# Patient Record
Sex: Female | Born: 1980 | Race: Asian | Hispanic: No | Marital: Married | Smoking: Never smoker
Health system: Southern US, Community
[De-identification: ages and names within clinical notes are randomized; demographics above are authoritative.]

## PROBLEM LIST (undated history)

## (undated) ENCOUNTER — Inpatient Hospital Stay (HOSPITAL_COMMUNITY): Payer: Self-pay

## (undated) DIAGNOSIS — Z789 Other specified health status: Secondary | ICD-10-CM

## (undated) HISTORY — PX: NO PAST SURGERIES: SHX2092

---
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
## 2011-01-06 ENCOUNTER — Ambulatory Visit: Payer: Self-pay

## 2013-09-14 ENCOUNTER — Encounter (HOSPITAL_COMMUNITY): Payer: Self-pay | Admitting: Emergency Medicine

## 2013-09-14 ENCOUNTER — Emergency Department (HOSPITAL_COMMUNITY)
Admission: EM | Admit: 2013-09-14 | Discharge: 2013-09-14 | Disposition: A | Discharge status: Home / Self Care | Payer: Self-pay | Attending: Emergency Medicine | Admitting: Emergency Medicine

## 2013-09-14 DIAGNOSIS — Z79899 Other long term (current) drug therapy: Secondary | ICD-10-CM | POA: Insufficient documentation

## 2013-09-14 DIAGNOSIS — Z3202 Encounter for pregnancy test, result negative: Secondary | ICD-10-CM | POA: Insufficient documentation

## 2013-09-14 DIAGNOSIS — K2970 Gastritis, unspecified, without bleeding: Secondary | ICD-10-CM | POA: Insufficient documentation

## 2013-09-14 DIAGNOSIS — R109 Unspecified abdominal pain: Secondary | ICD-10-CM | POA: Insufficient documentation

## 2013-09-14 DIAGNOSIS — K2990 Gastroduodenitis, unspecified, without bleeding: Secondary | ICD-10-CM

## 2013-09-14 DIAGNOSIS — Z87891 Personal history of nicotine dependence: Secondary | ICD-10-CM | POA: Insufficient documentation

## 2013-09-14 LAB — PREGNANCY, URINE: PREG TEST UR: NEGATIVE

## 2013-09-14 LAB — CBC WITH DIFFERENTIAL/PLATELET
BASOS ABS: 0 10*3/uL (ref 0.0–0.1)
Basophils Relative: 0 % (ref 0–1)
Eosinophils Absolute: 0.1 10*3/uL (ref 0.0–0.7)
Eosinophils Relative: 2 % (ref 0–5)
HCT: 41.5 % (ref 36.0–46.0)
Hemoglobin: 13.5 g/dL (ref 12.0–15.0)
LYMPHS PCT: 55 % — AB (ref 12–46)
Lymphs Abs: 3.1 10*3/uL (ref 0.7–4.0)
MCH: 27.6 pg (ref 26.0–34.0)
MCHC: 32.5 g/dL (ref 30.0–36.0)
MCV: 84.9 fL (ref 78.0–100.0)
MONO ABS: 0.2 10*3/uL (ref 0.1–1.0)
Monocytes Relative: 4 % (ref 3–12)
NEUTROS ABS: 2.2 10*3/uL (ref 1.7–7.7)
NEUTROS PCT: 39 % — AB (ref 43–77)
Platelets: 187 10*3/uL (ref 150–400)
RBC: 4.89 MIL/uL (ref 3.87–5.11)
RDW: 13 % (ref 11.5–15.5)
WBC: 5.7 10*3/uL (ref 4.0–10.5)

## 2013-09-14 LAB — URINALYSIS, ROUTINE W REFLEX MICROSCOPIC
Bilirubin Urine: NEGATIVE
GLUCOSE, UA: NEGATIVE mg/dL
Hgb urine dipstick: NEGATIVE
Ketones, ur: NEGATIVE mg/dL
Nitrite: NEGATIVE
Protein, ur: NEGATIVE mg/dL
SPECIFIC GRAVITY, URINE: 1.025 (ref 1.005–1.030)
UROBILINOGEN UA: 0.2 mg/dL (ref 0.0–1.0)
pH: 6.5 (ref 5.0–8.0)

## 2013-09-14 LAB — COMPREHENSIVE METABOLIC PANEL
ALK PHOS: 61 U/L (ref 39–117)
ALT: 13 U/L (ref 0–35)
AST: 18 U/L (ref 0–37)
Albumin: 4.3 g/dL (ref 3.5–5.2)
Anion gap: 13 (ref 5–15)
BUN: 16 mg/dL (ref 6–23)
CHLORIDE: 101 meq/L (ref 96–112)
CO2: 27 meq/L (ref 19–32)
CREATININE: 0.83 mg/dL (ref 0.50–1.10)
Calcium: 9.7 mg/dL (ref 8.4–10.5)
GFR calc Af Amer: >90 mL/min (ref >90)
Glucose, Bld: 98 mg/dL (ref 70–99)
POTASSIUM: 3.7 meq/L (ref 3.7–5.3)
Sodium: 141 mEq/L (ref 137–147)
Total Bilirubin: 0.6 mg/dL (ref 0.3–1.2)
Total Protein: 8.3 g/dL (ref 6.0–8.3)

## 2013-09-14 LAB — URINE MICROSCOPIC-ADD ON

## 2013-09-14 LAB — LIPASE, BLOOD: Lipase: 36 U/L (ref 11–59)

## 2013-09-14 MED ORDER — PANTOPRAZOLE SODIUM 40 MG IV SOLR
40.0000 mg | Freq: Once | INTRAVENOUS | Status: AC
  Administered 2013-09-14: 40 mg via INTRAVENOUS
  Filled 2013-09-14: qty 40

## 2013-09-14 MED ORDER — ONDANSETRON HCL 4 MG/2ML IJ SOLN
4.0000 mg | Freq: Once | INTRAMUSCULAR | Status: AC
  Administered 2013-09-14: 4 mg via INTRAVENOUS
  Filled 2013-09-14: qty 2

## 2013-09-14 MED ORDER — MORPHINE SULFATE 4 MG/ML IJ SOLN
4.0000 mg | INTRAMUSCULAR | Status: DC | PRN
  Administered 2013-09-14: 4 mg via INTRAVENOUS
  Filled 2013-09-14: qty 1

## 2013-09-14 MED ORDER — HYDROCODONE-ACETAMINOPHEN 5-325 MG PO TABS: 1.0000 | ORAL_TABLET | ORAL | Status: DC | PRN

## 2013-09-14 MED ORDER — OMEPRAZOLE 20 MG PO CPDR: 20.0000 mg | DELAYED_RELEASE_CAPSULE | Freq: Two times a day (BID) | ORAL | Status: DC

## 2013-09-14 MED ORDER — ONDANSETRON 4 MG PO TBDP: 4.0000 mg | ORAL_TABLET | Freq: Three times a day (TID) | ORAL | Status: DC | PRN

## 2013-09-14 NOTE — ED Notes (Signed)
MD at bedside. 

## 2013-09-14 NOTE — Discharge Instructions (Signed)
Abdominal Pain °Many things can cause abdominal pain. Usually, abdominal pain is not caused by a disease and will improve without treatment. It can often be observed and treated at home. Your health care provider will do a physical exam and possibly order blood tests and X-rays to help determine the seriousness of your pain. However, in many cases, more time must pass before a clear cause of the pain can be found. Before that point, your health care provider may not know if you need more testing or further treatment. °HOME CARE INSTRUCTIONS  °Monitor your abdominal pain for any changes. The following actions may help to alleviate any discomfort you are experiencing: °· Only take over-the-counter or prescription medicines as directed by your health care provider. °· Do not take laxatives unless directed to do so by your health care provider. °· Try a clear liquid diet (broth, tea, or water) as directed by your health care provider. Slowly move to a bland diet as tolerated. °SEEK MEDICAL CARE IF: °· You have unexplained abdominal pain. °· You have abdominal pain associated with nausea or diarrhea. °· You have pain when you urinate or have a bowel movement. °· You experience abdominal pain that wakes you in the night. °· You have abdominal pain that is worsened or improved by eating food. °· You have abdominal pain that is worsened with eating fatty foods. °· You have a fever. °SEEK IMMEDIATE MEDICAL CARE IF:  °· Your pain does not go away within 2 hours. °· You keep throwing up (vomiting). °· Your pain is felt only in portions of the abdomen, such as the right side or the left lower portion of the abdomen. °· You pass bloody or black tarry stools. °MAKE SURE YOU: °· Understand these instructions.   °· Will watch your condition.   °· Will get help right away if you are not doing well or get worse.   °Document Released: 10/04/2004 Document Revised: 12/30/2012 Document Reviewed: 09/03/2012 °ExitCare® Patient Information  ©2015 [employer]. This information is not intended to replace advice given to you by your health care provider. Make sure you discuss any questions you have with your health care provider. ° °Gastritis, Adult °Gastritis is soreness and puffiness (inflammation) of the lining of the stomach. If you do not get help, gastritis can cause bleeding and sores (ulcers) in the stomach. °HOME CARE  °· Only take medicine as told by your doctor. °· If you were given antibiotic medicines, take them as told. Finish the medicines even if you start to feel better. °· Drink enough fluids to keep your pee (urine) clear or pale yellow. °· Avoid foods and drinks that make your problems worse. Foods you may want to avoid include: °¨ Caffeine or alcohol. °¨ Chocolate. °¨ Mint. °¨ Garlic and onions. °¨ Spicy foods. °¨ Citrus fruits, including oranges, lemons, or limes. °¨ Food containing tomatoes, including sauce, chili, salsa, and pizza. °¨ Fried and fatty foods. °· Eat small meals throughout the day instead of large meals. °GET HELP RIGHT AWAY IF:  °· You have black or dark red poop (stools). °· You throw up (vomit) blood. It may look like coffee grounds. °· You cannot keep fluids down. °· Your belly (abdominal) pain gets worse. °· You have a fever. °· You do not feel better after 1 week. °· You have any other questions or concerns. °MAKE SURE YOU:  °· Understand these instructions. °· Will watch your condition. °· Will get help right away if you are not doing well   or get worse. °Document Released: 06/13/2007 Document Revised: 03/19/2011 Document Reviewed: 02/07/2011 °ExitCare® Patient Information ©2015 [employer]. This information is not intended to replace advice given to you by your health care provider. Make sure you discuss any questions you have with your health care provider. ° °

## 2013-09-14 NOTE — ED Notes (Signed)
Pt arrived to the Ed with a complaint of abdominal pain.  Pt states the pain is located in the right and left mid abdomen area with radiation to bilateral flanks.

## 2013-09-14 NOTE — ED Provider Notes (Signed)
CSN: [id number]     Arrival date & time 09/14/13  0544 History   First MD Initiated Contact with Patient 09/14/13 0645     Chief Complaint  Patient presents with  . Abdominal Pain      HPI  Pt presents with AP since last pm at 10:00.  Upper abdominal pain. Bilateral. Radiates to her flanks. No pain to the midline back. Some mild nausea tonight but no vomiting. Normal bowel movement this morning without diarrhea. No urinary symptoms. Does not smoke. Had a margarita with lunch yesterday. No heavy alcohol use. Anti-inflammatory use. No history of biliary symptoms or food intolerance. No prior episodes. Denies pregnancy.  No lower abdominal symptoms.  History reviewed. No pertinent past medical history. History reviewed. No pertinent past surgical history. History reviewed. No pertinent family history. History  Substance Use Topics  . Smoking status: Former Games developer  . Smokeless tobacco: Not on file  . Alcohol Use: Yes   OB History   Grav Para Term Preterm Abortions TAB SAB Ect Mult Living                 Review of Systems  Constitutional: Negative for fever, chills, diaphoresis, appetite change and fatigue.  HENT: Negative for mouth sores, sore throat and trouble swallowing.   Eyes: Negative for visual disturbance.  Respiratory: Negative for cough, chest tightness, shortness of breath and wheezing.   Cardiovascular: Negative for chest pain.  Gastrointestinal: Positive for nausea and abdominal pain. Negative for vomiting, diarrhea and abdominal distention.  Endocrine: Negative for polydipsia, polyphagia and polyuria.  Genitourinary: Negative for dysuria, frequency and hematuria.  Musculoskeletal: Negative for gait problem.  Skin: Negative for color change, pallor and rash.  Neurological: Negative for dizziness, syncope, light-headedness and headaches.  Hematological: Does not bruise/bleed easily.  Psychiatric/Behavioral: Negative for behavioral problems and confusion.       Allergies  Review of patient's allergies indicates no known allergies.  Home Medications   Prior to Admission medications   Medication Sig Start Date End Date Taking? Authorizing Provider  HYDROcodone-acetaminophen (NORCO/VICODIN) 5-325 MG per tablet Take 1 tablet by mouth every 4 (four) hours as needed. 09/14/13   Rolland Porter, MD  omeprazole (PRILOSEC) 20 MG capsule Take 1 capsule (20 mg total) by mouth 2 (two) times daily. 09/14/13   Rolland Porter, MD  ondansetron (ZOFRAN ODT) 4 MG disintegrating tablet Take 1 tablet (4 mg total) by mouth every 8 (eight) hours as needed for nausea. 09/14/13   Rolland Porter, MD   BP 139/88  Pulse 71  Temp(Src) 98.1 F (36.7 C) (Oral)  Resp 16  SpO2 100%  LMP 09/06/2013 Physical Exam  Constitutional: She is oriented to person, place, and time. She appears well-developed and well-nourished. No distress.  HENT:  Head: Normocephalic.  Eyes: Conjunctivae are normal. Pupils are equal, round, and reactive to light. No scleral icterus.  Neck: Normal range of motion. Neck supple. No thyromegaly present.  Cardiovascular: Normal rate and regular rhythm.  Exam reveals no gallop and no friction rub.   No murmur heard. Pulmonary/Chest: Effort normal and breath sounds normal. No respiratory distress. She has no wheezes. She has no rales.  Abdominal: Soft. Bowel sounds are normal. She exhibits no distension. There is no tenderness. There is no rigidity, no rebound and no guarding.    Musculoskeletal: Normal range of motion.       Back:  Neurological: She is alert and oriented to person, place, and time.  Skin: Skin is warm and dry.  No rash noted.  Psychiatric: She has a normal mood and affect. Her behavior is normal.    ED Course  Procedures (including critical care time) Labs Review Labs Reviewed  URINALYSIS, ROUTINE W REFLEX MICROSCOPIC - Abnormal; Notable for the following:    APPearance CLOUDY (*)    Leukocytes, UA SMALL (*)    All other components  within normal limits  CBC WITH DIFFERENTIAL - Abnormal; Notable for the following:    Neutrophils Relative % 39 (*)    Lymphocytes Relative 55 (*)    All other components within normal limits  PREGNANCY, URINE  COMPREHENSIVE METABOLIC PANEL  LIPASE, BLOOD  URINE MICROSCOPIC-ADD ON    Imaging Review No results found.   EKG Interpretation None      MDM   Final diagnoses:  Abdominal pain, unspecified abdominal location  Gastritis    Studies are reassuring. On recheck her pain is much improved. Up to 1/10. Points primarily to the epigastrium. I don't think she needs imaging ultrasound this time. Normal hepatobiliary and pancreatic enzymes. No leukocytosis. Plan is home. Bland diet. Small meals. Proton pump inhibitor. Followup if not improving.    Rolland Porter, MD 09/14/13 [phone]

## 2014-05-14 ENCOUNTER — Ambulatory Visit (INDEPENDENT_AMBULATORY_CARE_PROVIDER_SITE_OTHER): Payer: Self-pay | Admitting: Internal Medicine

## 2014-05-14 VITALS — BP 112/74 | HR 84 | Temp 98.2°F | Resp 16 | Ht 61.5 in | Wt 103.6 lb

## 2014-05-14 DIAGNOSIS — J029 Acute pharyngitis, unspecified: Secondary | ICD-10-CM

## 2014-05-14 MED ORDER — AMOXICILLIN 875 MG PO TABS: 875.0000 mg | ORAL_TABLET | Freq: Two times a day (BID) | ORAL | Status: DC

## 2014-05-14 NOTE — Progress Notes (Signed)
   Subjective:    Patient ID: Melissa Thomas, female    DOB: Apr 28, 1980, 34 y.o.   MRN: [id number] This chart was scribed for Ellamae Siaobert Elycia Woodside, MD by Littie Deedsichard Sun, Medical Scribe. This patient was seen in Room 2 and the patient's care was started at 6:00 PM.   HPI HPI Comments: Melissa Thomas is a 34 y.o. female who presents to the [facility] complaining of gradual onset sore throat that started 6 days ago. She reports having some difficulty swallowing and spitting due to the pain. She has no cough or nasal congestion but has noted chills. She also reports some difficulty with breathing yesterday, but has improved today. She had an episode of vomiting earlier today, but she attributes this to medications that she took. Patient denies fever and myalgias. She also denies sick contacts, but she does work at [employer]. No hx of tonsillectomy per patient.  Patient took the day off from work today due to illness.  Review of Systems Noncontributory    Objective:   Physical Exam  Constitutional: She is oriented to person, place, and time. She appears well-developed and well-nourished. No distress.  HENT:  Head: Normocephalic and atraumatic.  Nose: Nose normal.  TMs have old healed perforations and scars. Throat is very red with exudate. Tender 2+ AC nodes and 1 PC node.   Eyes: Conjunctivae are normal. Pupils are equal, round, and reactive to light.  Neck: Neck supple.  Cardiovascular: Normal rate and regular rhythm.   Pulmonary/Chest: Effort normal and breath sounds normal.  Musculoskeletal: She exhibits no edema.  Lymphadenopathy:    She has cervical adenopathy.  Neurological: She is alert and oriented to person, place, and time. No cranial nerve deficit.  Skin: Skin is warm and dry. No rash noted.  Psychiatric: She has a normal mood and affect. Her behavior is normal.  Vitals reviewed.     Assessment & Plan:  Bacterial pharyngitis--- amoxicillin 875 twice a day for 10  days  I have completed the patient encounter in its entirety as documented by the scribe, with editing by me where necessary. Ramya Vanbergen P. Merla Richesoolittle, M.D.

## 2015-08-11 ENCOUNTER — Ambulatory Visit: Payer: Self-pay

## 2015-08-19 ENCOUNTER — Emergency Department (HOSPITAL_BASED_OUTPATIENT_CLINIC_OR_DEPARTMENT_OTHER)
Admission: EM | Admit: 2015-08-19 | Discharge: 2015-08-19 | Disposition: A | Discharge status: Home / Self Care | Payer: Self-pay | Attending: Emergency Medicine | Admitting: Emergency Medicine

## 2015-08-19 ENCOUNTER — Encounter (HOSPITAL_BASED_OUTPATIENT_CLINIC_OR_DEPARTMENT_OTHER): Payer: Self-pay | Admitting: *Deleted

## 2015-08-19 DIAGNOSIS — H578 Other specified disorders of eye and adnexa: Secondary | ICD-10-CM | POA: Insufficient documentation

## 2015-08-19 DIAGNOSIS — L259 Unspecified contact dermatitis, unspecified cause: Secondary | ICD-10-CM | POA: Insufficient documentation

## 2015-08-19 DIAGNOSIS — R21 Rash and other nonspecific skin eruption: Secondary | ICD-10-CM

## 2015-08-19 LAB — RAPID HIV SCREEN (HIV 1/2 AB+AG)
HIV 1/2 Antibodies: NONREACTIVE
HIV-1 P24 ANTIGEN - HIV24: NONREACTIVE

## 2015-08-19 MED ORDER — HYDROXYZINE HCL 25 MG PO TABS
25.0000 mg | ORAL_TABLET | Freq: Once | ORAL | Status: AC
  Administered 2015-08-19: 25 mg via ORAL
  Filled 2015-08-19: qty 1

## 2015-08-19 MED ORDER — PREDNISONE 10 MG PO TABS: ORAL_TABLET | ORAL | 0 refills | Status: DC

## 2015-08-19 MED ORDER — PREDNISONE 10 MG PO TABS
60.0000 mg | ORAL_TABLET | Freq: Once | ORAL | Status: AC
  Administered 2015-08-19: 60 mg via ORAL
  Filled 2015-08-19: qty 1

## 2015-08-19 MED ORDER — HYDROXYZINE HCL 25 MG PO TABS: 25.0000 mg | ORAL_TABLET | Freq: Four times a day (QID) | ORAL | 0 refills | Status: DC

## 2015-08-19 NOTE — ED Triage Notes (Signed)
Rash since July. She was treated for scabies with no improvement. Her boyfriend has never had the rash.

## 2015-08-19 NOTE — ED Provider Notes (Signed)
[facility]   CSN: [id number] Arrival date & time: 08/19/15  2019  First Provider Contact:    First MD Initiated Contact with Patient 08/19/15 2030    By signing my name below, I, Levon Hedger, attest that this documentation has been prepared under the direction and in the presence of non-physician practitioner, Jaynie Crumble, PA-C  Electronically Signed: Levon Hedger, Scribe. 08/19/2015. 8:41 PM.   History   Chief Complaint Chief Complaint  Patient presents with  . Rash   HPI Melissa Thomas is a 35 y.o. female who presents to the Emergency Department complaining of itching, dark, worsening, rash to full body since 07/27/15. Pt states she went to [employer] and used Darene Lamer on her legs, states the rash showed up afterwards. Pt states she also changed dryer sheets recently, but denies change in lotions or soaps.  Pt was seen at [facility] and diagnosed with scabies. She was prescribed Permethrin which she had used daily.She has also been using hydrocortisone cream. She notes associated facial swelling and watery eyes. She denies any sick contact. She has no hx of eczema. Pt denies fever or lesions in her mouth. No generalized malaise or headache.  Boyfriend does not have a rash.  .  The history is provided by the patient. No language interpreter was used.    No past medical history on file.  There are no active problems to display for this patient.   No past surgical history on file.  OB History    No data available      Home Medications    Prior to Admission medications   Medication Sig Start Date End Date Taking? Authorizing Provider  amoxicillin (AMOXIL) 875 MG tablet Take 1 tablet (875 mg total) by mouth 2 (two) times daily. 05/14/14   Tonye Pearson, MD    Family History No family history on file.  Social History Social History  Substance Use Topics  . Smoking status: Never Smoker  . Smokeless tobacco: Not on file  . Alcohol use 0.0  oz/week     Allergies   Review of patient's allergies indicates no known allergies.   Review of Systems Review of Systems  Constitutional: Negative for fever.  HENT: Positive for facial swelling.   Eyes: Positive for discharge.  Skin: Positive for color change and rash.  All other systems reviewed and are negative.  Physical Exam Updated Vital Signs There were no vitals taken for this visit.  Physical Exam  Constitutional: She is oriented to person, place, and time. She appears well-developed and well-nourished. No distress.  HENT:  Head: Normocephalic and atraumatic.  Eyes: Conjunctivae are normal.  Cardiovascular: Normal rate.   Pulmonary/Chest: Effort normal.  Abdominal: She exhibits no distension.  Neurological: She is alert and oriented to person, place, and time.  Skin: Skin is warm and dry.  Diffuse dry, scaly, erythematous rash to bilateral legs, chest, abdomen, trunk, arms, neck, face. Multiple excoriations. Rash is blanching. Large plaques of this rash to bilateral anterior shins. Spares ankles, wrists, hands, webs of the toes and hands. Several spots noted to the left palm. No oral mucosal involvement.  Psychiatric: She has a normal mood and affect.  Nursing note and vitals reviewed.   ED Treatments / Results  DIAGNOSTIC STUDIES:  Oxygen Saturation is 100% on RA, normal by my interpretation.    COORDINATION OF CARE:  8:37 PM Discussed treatment plan which includes prednisone and hydroxyzine with pt at bedside and pt agreed to plan.  Labs (all labs ordered are listed, but only abnormal results are displayed) Labs Reviewed - No data to display  EKG  EKG Interpretation None       Radiology No results found.  Procedures Procedures (including critical care time)  Medications Ordered in ED Medications - No data to display   Initial Impression / Assessment and Plan / ED Course  I have reviewed the triage vital signs and the nursing  notes.  Pertinent labs & imaging results that were available during my care of the patient were reviewed by me and considered in my medical decision making (see chart for details).  Clinical Course    Patient with diffuse erythematous, scaly and dry rash. Rash does involve her palms, however it is mostly covers her shins, back, upper arms, forearms, neck and face. Multiple excoriations noted, states rash is very itchy. Did not respond to permethrin. Boyfriend does not have a rash, doubt scabies. Suspect most likely contact dermatitis from either Darene LamerNair, plants, or new dryer sheets. Due to diffuse widespread, will start on oral steroids. Will give a present is on taper and Vistaril for itching. Patient has no systemic symptoms at this time. No fever. She is nontoxic appearing otherwise. Will have her follow-up with dermatology.  RPR and HIV obtained  Final Clinical Impressions(s) / ED Diagnoses   Final diagnoses:  Rash  Contact dermatitis   I personally performed the services described in this documentation, which was scribed in my presence. The recorded information has been reviewed and is accurate.    New Prescriptions Discharge Medication List as of 08/19/2015  8:57 PM    START taking these medications   Details  hydrOXYzine (ATARAX/VISTARIL) 25 MG tablet Take 1 tablet (25 mg total) by mouth every 6 (six) hours., Starting Fri 08/19/2015, Print    predniSONE (DELTASONE) 10 MG tablet Take 6 tabs day 1, take 5 tabs day 2 and 3, take 4 tabs day 4 and 5, take 3 tabs day 6 and 7, take 2 tabs day 8 and 9, take 1 tab day 10 and 11, Print         Jaynie Crumbleatyana Orabelle Rylee, PA-C 08/19/15 2341    Nelva Nayobert Beaton, MD 08/20/15 [phone]

## 2015-08-19 NOTE — Discharge Instructions (Signed)
Prednisone as prescribed until all gone. Vistaril for itching. You can try calamine or Aveeno lotion to help with itching. Use only hypoallergenic soaps and detergents. If not improving, follow up with dermatology.

## 2015-08-19 NOTE — ED Notes (Signed)
Pt verbalizes understanding of d/c instructions and denies any further needs at this time. 

## 2015-08-21 LAB — RPR: RPR: NONREACTIVE

## 2015-12-08 ENCOUNTER — Inpatient Hospital Stay (HOSPITAL_COMMUNITY)
Admission: AD | Admit: 2015-12-08 | Discharge: 2015-12-09 | Disposition: A | Discharge status: Home / Self Care | Payer: BLUE CROSS/BLUE SHIELD | Source: Ambulatory Visit | Attending: Obstetrics and Gynecology | Admitting: Obstetrics and Gynecology

## 2015-12-08 ENCOUNTER — Encounter (HOSPITAL_COMMUNITY): Payer: Self-pay | Admitting: *Deleted

## 2015-12-08 DIAGNOSIS — Z3A14 14 weeks gestation of pregnancy: Secondary | ICD-10-CM | POA: Diagnosis not present

## 2015-12-08 DIAGNOSIS — R102 Pelvic and perineal pain: Secondary | ICD-10-CM | POA: Insufficient documentation

## 2015-12-08 DIAGNOSIS — O26892 Other specified pregnancy related conditions, second trimester: Secondary | ICD-10-CM | POA: Diagnosis not present

## 2015-12-08 DIAGNOSIS — N949 Unspecified condition associated with female genital organs and menstrual cycle: Secondary | ICD-10-CM | POA: Diagnosis not present

## 2015-12-08 DIAGNOSIS — O9989 Other specified diseases and conditions complicating pregnancy, childbirth and the puerperium: Secondary | ICD-10-CM | POA: Diagnosis not present

## 2015-12-08 HISTORY — DX: Other specified health status: Z78.9

## 2015-12-08 LAB — POCT PREGNANCY, URINE: PREG TEST UR: POSITIVE — AB

## 2015-12-08 LAB — URINALYSIS, ROUTINE W REFLEX MICROSCOPIC
Bilirubin Urine: NEGATIVE
Glucose, UA: NEGATIVE mg/dL
HGB URINE DIPSTICK: NEGATIVE
Ketones, ur: 15 mg/dL — AB
Leukocytes, UA: NEGATIVE
Nitrite: NEGATIVE
Protein, ur: NEGATIVE mg/dL
Specific Gravity, Urine: 1.02 (ref 1.005–1.030)
pH: 6 (ref 5.0–8.0)

## 2015-12-08 NOTE — MAU Note (Signed)
Pt reports sharp pain right lower quadrant. Denies bleeding.

## 2015-12-08 NOTE — MAU Provider Note (Signed)
History     CSN: [id number]  Arrival date and time: 12/08/15 2216   First Provider Initiated Contact with Patient 12/08/15 2258      Chief Complaint  Patient presents with  . Pelvic Pain   Pelvic Pain  The patient's primary symptoms include pelvic pain. This is a new problem. Episode onset: about 2 days ago. The problem occurs constantly. The problem has been unchanged. Pain severity now: 7.5/10. The problem affects the right side. She is pregnant. Associated symptoms include abdominal pain, dysuria and vomiting. Pertinent negatives include no chills, constipation, diarrhea, fever, frequency, nausea or urgency. The vaginal discharge was normal. There has been no bleeding. The symptoms are aggravated by activity. She has tried nothing for the symptoms. Menstrual history: LMP 08/28/15     Past Medical History:  Diagnosis Date  . Medical history non-contributory     Past Surgical History:  Procedure Laterality Date  . NO PAST SURGERIES      History reviewed. No pertinent family history.  Social History  Substance Use Topics  . Smoking status: Never Smoker  . Smokeless tobacco: Never Used  . Alcohol use 0.0 oz/week    Allergies: No Known Allergies  Prescriptions Prior to Admission  Medication Sig Dispense Refill Last Dose  . amoxicillin (AMOXIL) 875 MG tablet Take 1 tablet (875 mg total) by mouth 2 (two) times daily. 20 tablet 0   . hydrOXYzine (ATARAX/VISTARIL) 25 MG tablet Take 1 tablet (25 mg total) by mouth every 6 (six) hours. 20 tablet 0   . predniSONE (DELTASONE) 10 MG tablet Take 6 tabs day 1, take 5 tabs day 2 and 3, take 4 tabs day 4 and 5, take 3 tabs day 6 and 7, take 2 tabs day 8 and 9, take 1 tab day 10 and 11 36 tablet 0     Review of Systems  Constitutional: Negative for chills and fever.  Gastrointestinal: Positive for abdominal pain and vomiting. Negative for constipation, diarrhea and nausea.  Genitourinary: Positive for dysuria and pelvic pain.  Negative for frequency and urgency.   Physical Exam   Blood pressure 112/68, pulse 72, temperature 97.9 F (36.6 C), temperature source Oral, resp. rate 20, height 5\' 1"  (1.549 m), weight 110 lb (49.9 kg), last menstrual period 08/28/2015, SpO2 100 %.  Physical Exam  Nursing note and vitals reviewed. Constitutional: She is oriented to person, place, and time. She appears well-developed and well-nourished. No distress.  HENT:  Head: Normocephalic.  Cardiovascular: Normal rate.   Respiratory: Effort normal.  GI: Soft. There is no tenderness. There is no rebound.  Genitourinary:  Genitourinary Comments:  External: no lesion Vagina: small amount of white discharge Cervix: pink, smooth, no CMT Uterus: 14 week size, FHT 167 with doppler    Neurological: She is alert and oriented to person, place, and time.  Skin: Skin is warm and dry.  Psychiatric: She has a normal mood and affect.   Results for orders placed or performed during the hospital encounter of 12/08/15 (from the past 24 hour(s))  Urinalysis, Routine w reflex microscopic (not at [facility])     Status: Abnormal   Collection Time: 12/08/15 10:32 PM  Result Value Ref Range   Color, Urine YELLOW YELLOW   APPearance CLEAR CLEAR   Specific Gravity, Urine 1.020 1.005 - 1.030   pH 6.0 5.0 - 8.0   Glucose, UA NEGATIVE NEGATIVE mg/dL   Hgb urine dipstick NEGATIVE NEGATIVE   Bilirubin Urine NEGATIVE NEGATIVE   Ketones, ur 15 (  A) NEGATIVE mg/dL   Protein, ur NEGATIVE NEGATIVE mg/dL   Nitrite NEGATIVE NEGATIVE   Leukocytes, UA NEGATIVE NEGATIVE  Pregnancy, urine POC     Status: Abnormal   Collection Time: 12/08/15 10:39 PM  Result Value Ref Range   Preg Test, Ur POSITIVE (A) NEGATIVE  Wet prep, genital     Status: Abnormal   Collection Time: 12/08/15 11:59 PM  Result Value Ref Range   Yeast Wet Prep HPF POC NONE SEEN NONE SEEN   Trich, Wet Prep NONE SEEN NONE SEEN   Clue Cells Wet Prep HPF POC PRESENT (A) NONE SEEN   WBC, Wet  Prep HPF POC MANY (A) NONE SEEN   Sperm NONE SEEN      MAU Course  Procedures  MDM Patient has had 800mg  ibuprofen. She reports that her pain is better.   Assessment and Plan   1. Round ligament pain   2. [redacted] weeks gestation of pregnancy    DC home Comfort measures reviewed  2nd Trimester precautions  RX: none  Return to MAU as needed FU with OB as planned  Follow-up Information    Purcell NailsOBERTS,ANGELA Y, MD Follow up.   Specialty:  Obstetrics and Gynecology Contact information: [address] [place] [id number] [phone]            Tawnya CrookHogan, Carlin Mamone Donovan 12/08/2015, 11:04 PM

## 2015-12-09 DIAGNOSIS — Z3A14 14 weeks gestation of pregnancy: Secondary | ICD-10-CM

## 2015-12-09 DIAGNOSIS — N949 Unspecified condition associated with female genital organs and menstrual cycle: Secondary | ICD-10-CM | POA: Diagnosis not present

## 2015-12-09 DIAGNOSIS — O9989 Other specified diseases and conditions complicating pregnancy, childbirth and the puerperium: Secondary | ICD-10-CM

## 2015-12-09 LAB — WET PREP, GENITAL
SPERM: NONE SEEN
TRICH WET PREP: NONE SEEN
Yeast Wet Prep HPF POC: NONE SEEN

## 2015-12-09 LAB — GC/CHLAMYDIA PROBE AMP (~~LOC~~) NOT AT ARMC
CHLAMYDIA, DNA PROBE: NEGATIVE
Neisseria Gonorrhea: NEGATIVE

## 2015-12-09 MED ORDER — IBUPROFEN 800 MG PO TABS
800.0000 mg | ORAL_TABLET | Freq: Once | ORAL | Status: AC
  Administered 2015-12-09: 800 mg via ORAL
  Filled 2015-12-09: qty 1

## 2015-12-09 NOTE — Discharge Instructions (Signed)
Round Ligament Pain during Pregnancy Many women will experience a type of pain referred to as "round ligament pain" during their pregnancy. This is associated with abdominal pain or discomfort. Since any type of abdominal pain during pregnancy can be disconcerting, it is important to talk about round ligament pain to relieve any anxiety or fears you may have regarding the symptoms you are feeling. Round ligament pain is due to normal changes that take place in the body during pregnancy. It is caused by stretching of the round ligaments attached to the uterus. More commonly it occurs on the right side of the pelvis. Round Ligament: An Overview Typically in the non-pregnant state the uterus is about the size of an apple or pear. There are thick ligaments which hold the uterus in place in the abdomen, referred to as round ligaments. During pregnancy, your uterus will expand in size and weight, and the ligaments supporting it will have to stretch, becoming longer and thinner. As these ligaments pull and tug they may irritate nearby nerve fibers, which causes pain. The severity of the pain in some cases can seem extreme. Some common symptoms of round ligament pain include: . Ligament spasms or contractions/cramps that trigger a sharp pain typically on the right side of the abdomen. . Pain upon waking or suddenly rolling over in your sleep. . Pain in the abdomen that is sharp brought on by exercise or other vigorous activity. Similar Problems Round ligament pain is often mistaken for other medical conditions because the symptoms are similar. Acute abdominal pain during pregnancy may also be a sign of other conditions including: . Abdominal cramps - Some abdominal pain is simply caused by change in bowel habits associated with pregnancy. Gas is a common problem that can cause sharp, shooting pain. You should always seek out medical care if your pain is accompanied by fever, chills, pain  upon urination or if you have difficulty walking. Further exams and tests will be conducted to ensure that you do not have a more serious condition. It is not uncommon for women with lower abdominal pain to have a urinary tract infection, thus you may also be asked for a urine sample. Treatment If all other conditions are ruled out you can treat your round ligament pain relatively easily. You may be advised to take some acetaminophen (Tylenol) to reduce the severity of any persistent pain and asked to reduce your activity level. You can apply a heating pad to the area of pain or take a warm bath. Lying on the opposite side of the pain may help as well. Most women will find relief from round ligament pain simply by altering their daily routines slightly. The good news is round ligament pain will disappear completely once you have given birth to your child!   Safe Medications in Pregnancy   Acne: Benzoyl Peroxide Salicylic Acid  Backache/Headache: Tylenol: 2 regular strength every 4 hours OR              2 Extra strength every 6 hours  Colds/Coughs/Allergies: Benadryl (alcohol free) 25 mg every 6 hours as needed Breath right strips Claritin Cepacol throat lozenges Chloraseptic throat spray Cold-Eeze- up to three times per day Cough drops, alcohol free Flonase (by prescription only) Guaifenesin Mucinex Robitussin DM (plain only, alcohol free) Saline nasal spray/drops Sudafed (pseudoephedrine) & Actifed ** use only after [redacted] weeks gestation and if you do not have high blood pressure Tylenol Vicks Vaporub Zinc lozenges Zyrtec   Constipation: Colace Ducolax suppositories Fleet   enema Glycerin suppositories Metamucil Milk of magnesia Miralax Senokot Smooth move tea  Diarrhea: Kaopectate Imodium A-D  *NO pepto Bismol  Hemorrhoids: Anusol Anusol HC Preparation H Tucks  Indigestion: Tums Maalox Mylanta Zantac  Pepcid  Insomnia: Benadryl (alcohol free)  25mg every 6 hours as needed Tylenol PM Unisom, no Gelcaps  Leg Cramps: Tums MagGel  Nausea/Vomiting:  Bonine Dramamine Emetrol Ginger extract Sea bands Meclizine  Nausea medication to take during pregnancy:  Unisom (doxylamine succinate 25 mg tablets) Take one tablet daily at bedtime. If symptoms are not adequately controlled, the dose can be increased to a maximum recommended dose of two tablets daily (1/2 tablet in the morning, 1/2 tablet mid-afternoon and one at bedtime). Vitamin B6 100mg tablets. Take one tablet twice a day (up to 200 mg per day).  Skin Rashes: Aveeno products Benadryl cream or 25mg every 6 hours as needed Calamine Lotion 1% cortisone cream  Yeast infection: Gyne-lotrimin 7 Monistat 7   **If taking multiple medications, please check labels to avoid duplicating the same active ingredients **take medication as directed on the label ** Do not exceed 4000 mg of tylenol in 24 hours **Do not take medications that contain aspirin or ibuprofen     

## 2015-12-09 NOTE — MAU Note (Signed)
Pt reports pain for 2 days. Yesterday and today the pain has been worse.

## 2015-12-11 LAB — CULTURE, OB URINE: Culture: 80000 — AB

## 2015-12-12 ENCOUNTER — Telehealth (HOSPITAL_COMMUNITY): Payer: Self-pay | Admitting: Obstetrics and Gynecology

## 2015-12-12 MED ORDER — CEPHALEXIN 500 MG PO CAPS: 500.0000 mg | ORAL_CAPSULE | Freq: Four times a day (QID) | ORAL | 0 refills | Status: DC

## 2015-12-12 NOTE — Telephone Encounter (Signed)
+   urine culture, recently seen in MAU with dysuria. Keflex sent to the pharmacy.   Duane LopeJennifer I Angelee Bahr, NP

## 2015-12-20 DIAGNOSIS — Z3009 Encounter for other general counseling and advice on contraception: Secondary | ICD-10-CM | POA: Diagnosis not present

## 2015-12-20 DIAGNOSIS — Z3200 Encounter for pregnancy test, result unknown: Secondary | ICD-10-CM | POA: Diagnosis not present

## 2016-01-09 NOTE — L&D Delivery Note (Signed)
36 y.o. G2P1001 at 7848w4d delivered a viable female infant in cephalic, LOA position. loose nuchal cord, easily reduced. Anterior shoulder delivered with ease. 60 sec delayed cord clamping. Cord clamped x2 and cut. Placenta delivered spontaneously intact, with 3VC. Fundus firm on exam with massage and pitocin. Good hemostasis noted.  Anesthesia: Epidural Laceration: 2nd degree perineal and left labial Suture: 2.0 Vicryl Good hemostasis noted. EBL: 200 cc  Mom and baby recovering in LDR.    Apgars: APGAR (1 MIN): 8   APGAR (5 MINS): 9    Weight: Pending skin to skin  Sponge and instrument count were correct x2. Placenta sent to L&D.  Howard PouchLauren Feng, MD PGY-1 Family Medicine 05/31/2016, 11:39 AM   OB FELLOW DELIVERY ATTESTATION  I was gloved and present for the delivery in its entirety, and I agree with the above resident's note.    Ernestina PennaNicholas Schenk, MD 12:46 PM

## 2016-01-30 ENCOUNTER — Encounter: Payer: Self-pay | Admitting: Certified Nurse Midwife

## 2016-01-30 ENCOUNTER — Ambulatory Visit (INDEPENDENT_AMBULATORY_CARE_PROVIDER_SITE_OTHER): Payer: BLUE CROSS/BLUE SHIELD | Admitting: Certified Nurse Midwife

## 2016-01-30 VITALS — BP 110/70 | HR 98 | Temp 98.1°F | Wt 114.3 lb

## 2016-01-30 DIAGNOSIS — Z113 Encounter for screening for infections with a predominantly sexual mode of transmission: Secondary | ICD-10-CM | POA: Diagnosis not present

## 2016-01-30 DIAGNOSIS — Z1151 Encounter for screening for human papillomavirus (HPV): Secondary | ICD-10-CM

## 2016-01-30 DIAGNOSIS — Z3492 Encounter for supervision of normal pregnancy, unspecified, second trimester: Secondary | ICD-10-CM

## 2016-01-30 DIAGNOSIS — Z124 Encounter for screening for malignant neoplasm of cervix: Secondary | ICD-10-CM

## 2016-01-30 DIAGNOSIS — Z3490 Encounter for supervision of normal pregnancy, unspecified, unspecified trimester: Secondary | ICD-10-CM

## 2016-01-30 MED ORDER — PRENATE PIXIE 10-0.6-0.4-200 MG PO CAPS: 1.0000 | ORAL_CAPSULE | Freq: Every day | ORAL | 12 refills | Status: DC

## 2016-01-30 NOTE — Progress Notes (Signed)
Subjective:    Melissa Thomas is being seen today for her first obstetrical visit.  This is a planned pregnancy. She is at [redacted]w[redacted]d gestation. Her obstetrical history is significant for none. Relationship with FOB: spouse, living together. Patient does intend to breast feed. Pregnancy history fully reviewed.  Works as a Radio broadcast assistant.  Late to care d/t insurance issues.   The information documented in the HPI was reviewed and verified.  Menstrual History: OB History    Gravida Para Term Preterm AB Living   2 1 1     1    SAB TAB Ectopic Multiple Live Births                   Patient's last menstrual period was 08/28/2015 (approximate).    Past Medical History:  Diagnosis Date  . Medical history non-contributory     Past Surgical History:  Procedure Laterality Date  . NO PAST SURGERIES       (Not in a hospital admission) No Known Allergies  Social History  Substance Use Topics  . Smoking status: Never Smoker  . Smokeless tobacco: Never Used  . Alcohol use 0.0 oz/week    Family History  Problem Relation Age of Onset  . Hypertension Father      Review of Systems Constitutional: negative for weight loss Gastrointestinal: negative for vomiting Genitourinary:negative for genital lesions and vaginal discharge and dysuria Musculoskeletal:negative for back pain Behavioral/Psych: negative for abusive relationship, depression, illegal drug usage and tobacco use    Objective:    BP 110/70   Pulse 98   Temp 98.1 F (36.7 C)   Wt 114 lb 4.8 oz (51.8 kg)   LMP 08/28/2015 (Approximate)   BMI 21.60 kg/m  General Appearance:    Alert, cooperative, no distress, appears stated age  Head:    Normocephalic, without obvious abnormality, atraumatic  Eyes:    PERRL, conjunctiva/corneas clear, EOM's intact, fundi    benign, both eyes  Ears:    Normal TM's and external ear canals, both ears  Nose:   Nares normal, septum midline, mucosa normal, no drainage    or sinus tenderness  Throat:    Lips, mucosa, and tongue normal; teeth and gums normal  Neck:   Supple, symmetrical, trachea midline, no adenopathy;    thyroid:  no enlargement/tenderness/nodules; no carotid   bruit or JVD  Back:     Symmetric, no curvature, ROM normal, no CVA tenderness  Lungs:     Clear to auscultation bilaterally, respirations unlabored  Chest Wall:    No tenderness or deformity   Heart:    Regular rate and rhythm, S1 and S2 normal, no murmur, rub   or gallop  Breast Exam:    No tenderness, masses, or nipple abnormality  Abdomen:     Soft, non-tender, bowel sounds active all four quadrants,    no masses, no organomegaly  Genitalia:    Normal female without lesion, discharge or tenderness  Extremities:   Extremities normal, atraumatic, no cyanosis or edema  Pulses:   2+ and symmetric all extremities  Skin:   Skin color, texture, turgor normal, no rashes or lesions  Lymph nodes:   Cervical, supraclavicular, and axillary nodes normal  Neurologic:   CNII-XII intact, normal strength, sensation and reflexes    throughout                                 Cervix: long,  thick, closed and posterior.  FHR: 160 by doppler.  FH: 21cm.       Lab Review Urine pregnancy test Labs reviewed yes Radiologic studies reviewed no Assessment:    Pregnancy at 103w1d weeks   Late to care  Plan:      Prenatal vitamins.  Counseling provided regarding continued use of seat belts, cessation of alcohol consumption, smoking or use of illicit drugs; infection precautions i.e., influenza/TDAP immunizations, toxoplasmosis,CMV, parvovirus, listeria and varicella; workplace safety, exercise during pregnancy; routine dental care, safe medications, sexual activity, hot tubs, saunas, pools, travel, caffeine use, fish and methlymercury, potential toxins, hair treatments, varicose veins Weight gain recommendations per IOM guidelines reviewed: underweight/BMI< 18.5--> gain 28 - 40 lbs; normal weight/BMI 18.5 - 24.9--> gain 25 - 35 lbs;  overweight/BMI 25 - 29.9--> gain 15 - 25 lbs; obese/BMI >30->gain  11 - 20 lbs Problem list reviewed and updated. FIRST/CF mutation testing/NIPT/QUAD SCREEN/fragile X/Ashkenazi Jewish population testing/Spinal muscular atrophy discussed: ordered. Role of ultrasound in pregnancy discussed; fetal survey: ordered. Amniocentesis discussed: not indicated. VBAC calculator score: VBAC consent form provided No orders of the defined types were placed in this encounter.  Orders Placed This Encounter  Procedures  . Culture, OB Urine  . [place] MFM OB COMP + 14 WK    Standing Status:   Future    Standing Expiration Date:   03/29/2017    Order Specific Question:   Reason for Exam (SYMPTOM  OR DIAGNOSIS REQUIRED)    Answer:   fetal anatomy scan    Order Specific Question:   Preferred imaging location?    Answer:   [facility]Ultrasound  . TSH  . Hemoglobinopathy evaluation  . Varicella zoster antibody, IgG  . MaterniT21 PLUS Core+SCA    Order Specific Question:   Is the patient insulin dependent?    Answer:   No    Order Specific Question:   Please enter gestational age. This should be expressed as weeks AND days, i.e. 16w 6d. Enter weeks here. Enter days in next question.    Answer:   60    Order Specific Question:   Please enter gestational age. This should be expressed as weeks AND days, i.e. 16w 6d. Enter days here. Enter weeks in previous question.    Answer:   1    Order Specific Question:   How was gestational age calculated?    Answer:   LMP    Order Specific Question:   Please give the date of LMP OR Ultrasound OR Estimated date of delivery.    Answer:   06/03/2016    Order Specific Question:   Number of Fetuses (Type of Pregnancy):    Answer:   1    Order Specific Question:   Indications for performing the test? (please choose all that apply):    Answer:   Routine screening    Order Specific Question:   Other Indications? (Y=Yes, N=No)    Answer:   Y    Order Specific Question:   Please specify  other indications, if any:    Answer:   late prenatal care    Order Specific Question:   If this is a repeat specimen, please indicate the reason:    Answer:   Not indicated    Order Specific Question:   Please specify the patient's race: (C=White/Caucasion, B=Black, I=Native American, A=Asian, H=Hispanic, O=Other, U=Unknown)    Answer:   O    Order Specific Question:   Donor Egg - indicate if the egg  was obtained from in vitro fertilization.    Answer:   N    Order Specific Question:   Age of Egg Donor.    Answer:   7934    Order Specific Question:   Prior Down Syndrome/ONTD screening during current pregnancy.    Answer:   N    Order Specific Question:   Prior First Trimester Testing    Answer:   N    Order Specific Question:   Prior Second Trimester Testing    Answer:   N    Order Specific Question:   Family History of Neural Tube Defects    Answer:   N    Order Specific Question:   Prior Pregnancy with Down Syndrome    Answer:   N    Order Specific Question:   Please give the patient's weight (in pounds)    Answer:   114  . ToxASSURE Select 13 (MW), Urine  . Hemoglobin A1c  . Obstetric Panel, Including HIV  . Cystic Fibrosis Mutation 97    Follow up in 4 weeks. 50% of 30 min visit spent on counseling and coordination of care.

## 2016-01-30 NOTE — Progress Notes (Signed)
Baby  

## 2016-02-01 ENCOUNTER — Ambulatory Visit (HOSPITAL_COMMUNITY)
Admission: RE | Admit: 2016-02-01 | Discharge: 2016-02-01 | Disposition: A | Discharge status: Home / Self Care | Payer: BLUE CROSS/BLUE SHIELD | Source: Ambulatory Visit | Attending: Certified Nurse Midwife | Admitting: Certified Nurse Midwife

## 2016-02-01 DIAGNOSIS — Z3A22 22 weeks gestation of pregnancy: Secondary | ICD-10-CM | POA: Diagnosis not present

## 2016-02-01 DIAGNOSIS — Z3490 Encounter for supervision of normal pregnancy, unspecified, unspecified trimester: Secondary | ICD-10-CM

## 2016-02-01 DIAGNOSIS — Z363 Encounter for antenatal screening for malformations: Secondary | ICD-10-CM | POA: Diagnosis not present

## 2016-02-01 LAB — CERVICOVAGINAL ANCILLARY ONLY
Bacterial vaginitis: POSITIVE — AB
CANDIDA VAGINITIS: NEGATIVE
Chlamydia: NEGATIVE
Neisseria Gonorrhea: NEGATIVE
TRICH (WINDOWPATH): NEGATIVE

## 2016-02-02 ENCOUNTER — Other Ambulatory Visit: Payer: Self-pay | Admitting: Certified Nurse Midwife

## 2016-02-02 DIAGNOSIS — B9689 Other specified bacterial agents as the cause of diseases classified elsewhere: Secondary | ICD-10-CM

## 2016-02-02 DIAGNOSIS — N760 Acute vaginitis: Secondary | ICD-10-CM

## 2016-02-02 LAB — CYTOLOGY - PAP
Diagnosis: NEGATIVE
HPV: NOT DETECTED

## 2016-02-02 LAB — CULTURE, OB URINE

## 2016-02-02 LAB — URINE CULTURE, OB REFLEX

## 2016-02-02 MED ORDER — METRONIDAZOLE 500 MG PO TABS: 500.0000 mg | ORAL_TABLET | Freq: Two times a day (BID) | ORAL | 0 refills | Status: DC

## 2016-02-03 ENCOUNTER — Other Ambulatory Visit: Payer: Self-pay | Admitting: Certified Nurse Midwife

## 2016-02-03 DIAGNOSIS — Z3480 Encounter for supervision of other normal pregnancy, unspecified trimester: Secondary | ICD-10-CM

## 2016-02-05 LAB — TOXASSURE SELECT 13 (MW), URINE

## 2016-02-06 LAB — MATERNIT21 PLUS CORE+SCA
CHROMOSOME 18: NEGATIVE
Chromosome 13: NEGATIVE
Chromosome 21: NEGATIVE
Y CHROMOSOME: DETECTED

## 2016-02-07 LAB — OBSTETRIC PANEL, INCLUDING HIV
ANTIBODY SCREEN: NEGATIVE
BASOS: 0 %
Basophils Absolute: 0 10*3/uL (ref 0.0–0.2)
EOS (ABSOLUTE): 0.1 10*3/uL (ref 0.0–0.4)
EOS: 1 %
HEMOGLOBIN: 11.9 g/dL (ref 11.1–15.9)
HIV Screen 4th Generation wRfx: NONREACTIVE
Hematocrit: 34.3 % (ref 34.0–46.6)
Hepatitis B Surface Ag: NEGATIVE
Immature Grans (Abs): 0.1 10*3/uL (ref 0.0–0.1)
Immature Granulocytes: 1 %
Lymphocytes Absolute: 2.3 10*3/uL (ref 0.7–3.1)
Lymphs: 26 %
MCH: 29 pg (ref 26.6–33.0)
MCHC: 34.7 g/dL (ref 31.5–35.7)
MCV: 84 fL (ref 79–97)
MONOS ABS: 0.5 10*3/uL (ref 0.1–0.9)
Monocytes: 5 %
Neutrophils Absolute: 5.9 10*3/uL (ref 1.4–7.0)
Neutrophils: 67 %
Platelets: 237 10*3/uL (ref 150–379)
RBC: 4.11 x10E6/uL (ref 3.77–5.28)
RDW: 14.6 % (ref 12.3–15.4)
RPR Ser Ql: NONREACTIVE
Rh Factor: POSITIVE
Rubella Antibodies, IGG: 8.77 index (ref >0.99)
WBC: 8.8 10*3/uL (ref 3.4–10.8)

## 2016-02-07 LAB — HEMOGLOBINOPATHY EVALUATION
HEMOGLOBIN A2 QUANTITATION: 2.8 % (ref 1.8–3.2)
HEMOGLOBIN F QUANTITATION: 0 % (ref 0.0–2.0)
HGB A: 97.2 % (ref 96.4–98.8)
HGB C: 0 %
HGB S: 0 %
HGB VARIANT: 0 %

## 2016-02-07 LAB — TSH: TSH: 0.829 u[IU]/mL (ref 0.450–4.500)

## 2016-02-07 LAB — CYSTIC FIBROSIS MUTATION 97: Interpretation: NOT DETECTED

## 2016-02-07 LAB — HEMOGLOBIN A1C
Est. average glucose Bld gHb Est-mCnc: 97 mg/dL
Hgb A1c MFr Bld: 5 % (ref 4.8–5.6)

## 2016-02-07 LAB — VARICELLA ZOSTER ANTIBODY, IGG: Varicella zoster IgG: >4000 index (ref >165)

## 2016-02-15 ENCOUNTER — Ambulatory Visit (INDEPENDENT_AMBULATORY_CARE_PROVIDER_SITE_OTHER): Payer: BLUE CROSS/BLUE SHIELD | Admitting: Certified Nurse Midwife

## 2016-02-15 DIAGNOSIS — Z3480 Encounter for supervision of other normal pregnancy, unspecified trimester: Secondary | ICD-10-CM

## 2016-02-15 DIAGNOSIS — Z3482 Encounter for supervision of other normal pregnancy, second trimester: Secondary | ICD-10-CM

## 2016-02-15 NOTE — Progress Notes (Signed)
Pt states some lower back pain.  Pt states this makes sleeping hard at night. Pt states that she has had bad dreams since starting on Flagyl, ? Related.

## 2016-02-15 NOTE — Progress Notes (Signed)
   PRENATAL VISIT NOTE  Subjective:  Melissa Thomas is a 36 y.o. G2P1001 at 6987w3d being seen today for ongoing prenatal care.  She is currently monitored for the following issues for this low-risk pregnancy and has Supervision of normal pregnancy, antepartum on her problem list.  Patient reports backache, no bleeding, no contractions, no cramping, no leaking and nightmares about the pregnancy, reassurance given.  Contractions: Not present. Vag. Bleeding: None.  Movement: Present. Denies leaking of fluid.   The following portions of the patient's history were reviewed and updated as appropriate: allergies, current medications, past family history, past medical history, past social history, past surgical history and problem list. Problem list updated.  Objective:   Vitals:   02/15/16 0958  BP: 113/78  Pulse: 87  Weight: 116 lb (52.6 kg)    Fetal Status: Fetal Heart Rate (bpm): 157 Fundal Height: 24 cm Movement: Present     General:  Alert, oriented and cooperative. Patient is in no acute distress.  Skin: Skin is warm and dry. No rash noted.   Cardiovascular: Normal heart rate noted  Respiratory: Normal respiratory effort, no problems with respiration noted  Abdomen: Soft, gravid, appropriate for gestational age. Pain/Pressure: Absent     Pelvic:  Cervical exam deferred        Extremities: Normal range of motion.     Mental Status: Normal mood and affect. Normal behavior. Normal judgment and thought content.   Assessment and Plan:  Pregnancy: G2P1001 at 1287w3d  1. Supervision of other normal pregnancy, antepartum     Normal pregnancy discomforts.  2 hour OGTT next ROB. F/U US scheduled for 03/05/16.  Preterm. labor symptoms and general obstetric precautions including but not limited to vaginal bleeding, contractions, leaking of fluid and fetal movement were reviewed in detail with the patient. Please refer to After Visit Summary for other counseling recommendations.  Return in about 4  weeks (around 03/14/2016) for 2 hr OGTT.   Roe Coombsachelle A Lacretia Tindall, CNM

## 2016-03-05 ENCOUNTER — Ambulatory Visit (HOSPITAL_COMMUNITY)
Admission: RE | Admit: 2016-03-05 | Discharge: 2016-03-05 | Disposition: A | Discharge status: Home / Self Care | Payer: BLUE CROSS/BLUE SHIELD | Source: Ambulatory Visit | Attending: Certified Nurse Midwife | Admitting: Certified Nurse Midwife

## 2016-03-05 DIAGNOSIS — Z3A26 26 weeks gestation of pregnancy: Secondary | ICD-10-CM | POA: Diagnosis not present

## 2016-03-05 DIAGNOSIS — Z362 Encounter for other antenatal screening follow-up: Secondary | ICD-10-CM | POA: Diagnosis not present

## 2016-03-05 DIAGNOSIS — Z3480 Encounter for supervision of other normal pregnancy, unspecified trimester: Secondary | ICD-10-CM

## 2016-03-06 ENCOUNTER — Other Ambulatory Visit: Payer: Self-pay | Admitting: Certified Nurse Midwife

## 2016-03-06 DIAGNOSIS — Z3480 Encounter for supervision of other normal pregnancy, unspecified trimester: Secondary | ICD-10-CM

## 2016-03-14 ENCOUNTER — Ambulatory Visit (INDEPENDENT_AMBULATORY_CARE_PROVIDER_SITE_OTHER): Payer: BLUE CROSS/BLUE SHIELD | Admitting: Obstetrics & Gynecology

## 2016-03-14 ENCOUNTER — Other Ambulatory Visit: Payer: BLUE CROSS/BLUE SHIELD

## 2016-03-14 DIAGNOSIS — Z23 Encounter for immunization: Secondary | ICD-10-CM

## 2016-03-14 DIAGNOSIS — Z3480 Encounter for supervision of other normal pregnancy, unspecified trimester: Secondary | ICD-10-CM

## 2016-03-14 DIAGNOSIS — Z3483 Encounter for supervision of other normal pregnancy, third trimester: Secondary | ICD-10-CM

## 2016-03-14 NOTE — Progress Notes (Signed)
   PRENATAL VISIT NOTE  Subjective:  Melissa Thomas is a 36 y.o. G2P1001 at 346w3d being seen today for ongoing prenatal care.  She is currently monitored for the following issues for this low-risk pregnancy and has Supervision of normal pregnancy, antepartum on her problem list.  Patient reports no complaints.  Contractions: Not present. Vag. Bleeding: None.  Movement: Present. Denies leaking of fluid.   The following portions of the patient's history were reviewed and updated as appropriate: allergies, current medications, past family history, past medical history, past social history, past surgical history and problem list. Problem list updated.  Objective:   Vitals:   03/14/16 0849  BP: 111/72  Pulse: 72  Weight: 120 lb (54.4 kg)    Fetal Status: Fetal Heart Rate (bpm): 140 Fundal Height: 28 cm Movement: Present     General:  Alert, oriented and cooperative. Patient is in no acute distress.  Skin: Skin is warm and dry. No rash noted.   Cardiovascular: Normal heart rate noted  Respiratory: Normal respiratory effort, no problems with respiration noted  Abdomen: Soft, gravid, appropriate for gestational age. Pain/Pressure: Absent     Pelvic:  Cervical exam deferred        Extremities: Normal range of motion.  Edema: None  Mental Status: Normal mood and affect. Normal behavior. Normal judgment and thought content.   Assessment and Plan:  Pregnancy: G2P1001 at 1046w3d  1. Supervision of other normal pregnancy, antepartum  - Glucose Tolerance, 2 Hours w/1 Hour - CBC - HIV antibody (with reflex) - RPR  Preterm labor symptoms and general obstetric precautions including but not limited to vaginal bleeding, contractions, leaking of fluid and fetal movement were reviewed in detail with the patient. Please refer to After Visit Summary for other counseling recommendations.  Return in about 2 weeks (around 03/28/2016).    Adam PhenixJames G Ashon Rosenberg, MD

## 2016-03-14 NOTE — Patient Instructions (Signed)
Third Trimester of Pregnancy The third trimester is from week 28 through week 40 (months 7 through 9). The third trimester is a time when the unborn baby (fetus) is growing rapidly. At the end of the ninth month, the fetus is about 20 inches in length and weighs 6-10 pounds. Body changes during your third trimester Your body will continue to go through many changes during pregnancy. The changes vary from woman to woman. During the third trimester:  Your weight will continue to increase. You can expect to gain 25-35 pounds (11-16 kg) by the end of the pregnancy.  You may begin to get stretch marks on your hips, abdomen, and breasts.  You may urinate more often because the fetus is moving lower into your pelvis and pressing on your bladder.  You may develop or continue to have heartburn. This is caused by increased hormones that slow down muscles in the digestive tract.  You may develop or continue to have constipation because increased hormones slow digestion and cause the muscles that push waste through your intestines to relax.  You may develop hemorrhoids. These are swollen veins (varicose veins) in the rectum that can itch or be painful.  You may develop swollen, bulging veins (varicose veins) in your legs.  You may have increased body aches in the pelvis, back, or thighs. This is due to weight gain and increased hormones that are relaxing your joints.  You may have changes in your hair. These can include thickening of your hair, rapid growth, and changes in texture. Some women also have hair loss during or after pregnancy, or hair that feels dry or thin. Your hair will most likely return to normal after your baby is born.  Your breasts will continue to grow and they will continue to become tender. A yellow fluid (colostrum) may leak from your breasts. This is the first milk you are producing for your baby.  Your belly button may stick out.  You may notice more swelling in your hands,  face, or ankles.  You may have increased tingling or numbness in your hands, arms, and legs. The skin on your belly may also feel numb.  You may feel short of breath because of your expanding uterus.  You may have more problems sleeping. This can be caused by the size of your belly, increased need to urinate, and an increase in your body's metabolism.  You may notice the fetus "dropping," or moving lower in your abdomen (lightening).  You may have increased vaginal discharge.  You may notice your joints feel loose and you may have pain around your pelvic bone.  What to expect at prenatal visits You will have prenatal exams every 2 weeks until week 36. Then you will have weekly prenatal exams. During a routine prenatal visit:  You will be weighed to make sure you and the baby are growing normally.  Your blood pressure will be taken.  Your abdomen will be measured to track your baby's growth.  The fetal heartbeat will be listened to.  Any test results from the previous visit will be discussed.  You may have a cervical check near your due date to see if your cervix has softened or thinned (effaced).  You will be tested for Group B streptococcus. This happens between 35 and 37 weeks.  Your health care provider may ask you:  What your birth plan is.  How you are feeling.  If you are feeling the baby move.  If you have had   any abnormal symptoms, such as leaking fluid, bleeding, severe headaches, or abdominal cramping.  If you are using any tobacco products, including cigarettes, chewing tobacco, and electronic cigarettes.  If you have any questions.  Other tests or screenings that may be performed during your third trimester include:  Blood tests that check for low iron levels (anemia).  Fetal testing to check the health, activity level, and growth of the fetus. Testing is done if you have certain medical conditions or if there are problems during the  pregnancy.  Nonstress test (NST). This test checks the health of your baby to make sure there are no signs of problems, such as the baby not getting enough oxygen. During this test, a belt is placed around your belly. The baby is made to move, and its heart rate is monitored during movement.  What is false labor? False labor is a condition in which you feel small, irregular tightenings of the muscles in the womb (contractions) that usually go away with rest, changing position, or drinking water. These are called Braxton Hicks contractions. Contractions may last for hours, days, or even weeks before true labor sets in. If contractions come at regular intervals, become more frequent, increase in intensity, or become painful, you should see your health care provider. What are the signs of labor?  Abdominal cramps.  Regular contractions that start at 10 minutes apart and become stronger and more frequent with time.  Contractions that start on the top of the uterus and spread down to the lower abdomen and back.  Increased pelvic pressure and dull back pain.  A watery or bloody mucus discharge that comes from the vagina.  Leaking of amniotic fluid. This is also known as your "water breaking." It could be a slow trickle or a gush. Let your health care provider know if it has a color or strange odor. If you have any of these signs, call your health care provider right away, even if it is before your due date. Follow these instructions at home: Medicines  Follow your health care provider's instructions regarding medicine use. Specific medicines may be either safe or unsafe to take during pregnancy.  Take a prenatal vitamin that contains at least 600 micrograms (mcg) of folic acid.  If you develop constipation, try taking a stool softener if your health care provider approves. Eating and drinking  Eat a balanced diet that includes fresh fruits and vegetables, whole grains, good sources of protein  such as meat, eggs, or tofu, and low-fat dairy. Your health care provider will help you determine the amount of weight gain that is right for you.  Avoid raw meat and uncooked cheese. These carry germs that can cause birth defects in the baby.  If you have low calcium intake from food, talk to your health care provider about whether you should take a daily calcium supplement.  Eat four or five small meals rather than three large meals a day.  Limit foods that are high in fat and processed sugars, such as fried and sweet foods.  To prevent constipation: ? Drink enough fluid to keep your urine clear or pale yellow. ? Eat foods that are high in fiber, such as fresh fruits and vegetables, whole grains, and beans. Activity  Exercise only as directed by your health care provider. Most women can continue their usual exercise routine during pregnancy. Try to exercise for 30 minutes at least 5 days a week. Stop exercising if you experience uterine contractions.  Avoid heavy   lifting.  Do not exercise in extreme heat or humidity, or at high altitudes.  Wear low-heel, comfortable shoes.  Practice good posture.  You may continue to have sex unless your health care provider tells you otherwise. Relieving pain and discomfort  Take frequent breaks and rest with your legs elevated if you have leg cramps or low back pain.  Take warm sitz baths to soothe any pain or discomfort caused by hemorrhoids. Use hemorrhoid cream if your health care provider approves.  Wear a good support bra to prevent discomfort from breast tenderness.  If you develop varicose veins: ? Wear support pantyhose or compression stockings as told by your healthcare provider. ? Elevate your feet for 15 minutes, 3-4 times a day. Prenatal care  Write down your questions. Take them to your prenatal visits.  Keep all your prenatal visits as told by your health care provider. This is important. Safety  Wear your seat belt at  all times when driving.  Make a list of emergency phone numbers, including numbers for family, friends, the hospital, and police and fire departments. General instructions  Avoid cat litter boxes and soil used by cats. These carry germs that can cause birth defects in the baby. If you have a cat, ask someone to clean the litter box for you.  Do not travel far distances unless it is absolutely necessary and only with the approval of your health care provider.  Do not use hot tubs, steam rooms, or saunas.  Do not drink alcohol.  Do not use any products that contain nicotine or tobacco, such as cigarettes and e-cigarettes. If you need help quitting, ask your health care provider.  Do not use any medicinal herbs or unprescribed drugs. These chemicals affect the formation and growth of the baby.  Do not douche or use tampons or scented sanitary pads.  Do not cross your legs for long periods of time.  To prepare for the arrival of your baby: ? Take prenatal classes to understand, practice, and ask questions about labor and delivery. ? Make a trial run to the hospital. ? Visit the hospital and tour the maternity area. ? Arrange for maternity or paternity leave through employers. ? Arrange for family and friends to take care of pets while you are in the hospital. ? Purchase a rear-facing car seat and make sure you know how to install it in your car. ? Pack your hospital bag. ? Prepare the baby's nursery. Make sure to remove all pillows and stuffed animals from the baby's crib to prevent suffocation.  Visit your dentist if you have not gone during your pregnancy. Use a soft toothbrush to brush your teeth and be gentle when you floss. Contact a health care provider if:  You are unsure if you are in labor or if your water has broken.  You become dizzy.  You have mild pelvic cramps, pelvic pressure, or nagging pain in your abdominal area.  You have lower back pain.  You have persistent  nausea, vomiting, or diarrhea.  You have an unusual or bad smelling vaginal discharge.  You have pain when you urinate. Get help right away if:  Your water breaks before 37 weeks.  You have regular contractions less than 5 minutes apart before 37 weeks.  You have a fever.  You are leaking fluid from your vagina.  You have spotting or bleeding from your vagina.  You have severe abdominal pain or cramping.  You have rapid weight loss or weight gain.    You have shortness of breath with chest pain.  You notice sudden or extreme swelling of your face, hands, ankles, feet, or legs.  Your baby makes fewer than 10 movements in 2 hours.  You have severe headaches that do not go away when you take medicine.  You have vision changes. Summary  The third trimester is from week 28 through week 40, months 7 through 9. The third trimester is a time when the unborn baby (fetus) is growing rapidly.  During the third trimester, your discomfort may increase as you and your baby continue to gain weight. You may have abdominal, leg, and back pain, sleeping problems, and an increased need to urinate.  During the third trimester your breasts will keep growing and they will continue to become tender. A yellow fluid (colostrum) may leak from your breasts. This is the first milk you are producing for your baby.  False labor is a condition in which you feel small, irregular tightenings of the muscles in the womb (contractions) that eventually go away. These are called Braxton Hicks contractions. Contractions may last for hours, days, or even weeks before true labor sets in.  Signs of labor can include: abdominal cramps; regular contractions that start at 10 minutes apart and become stronger and more frequent with time; watery or bloody mucus discharge that comes from the vagina; increased pelvic pressure and dull back pain; and leaking of amniotic fluid. This information is not intended to replace advice  given to you by your health care provider. Make sure you discuss any questions you have with your health care provider. Document Released: 12/19/2000 Document Revised: 06/02/2015 Document Reviewed: 02/26/2012 Elsevier Interactive Patient Education  2017 [employer]  

## 2016-03-15 LAB — CBC
Hematocrit: 35.5 % (ref 34.0–46.6)
Hemoglobin: 11.7 g/dL (ref 11.1–15.9)
MCH: 28.7 pg (ref 26.6–33.0)
MCHC: 33 g/dL (ref 31.5–35.7)
MCV: 87 fL (ref 79–97)
Platelets: 200 10*3/uL (ref 150–379)
RBC: 4.08 x10E6/uL (ref 3.77–5.28)
RDW: 14.3 % (ref 12.3–15.4)
WBC: 7.4 10*3/uL (ref 3.4–10.8)

## 2016-03-15 LAB — GLUCOSE TOLERANCE, 2 HOURS W/ 1HR
Glucose, 1 hour: 111 mg/dL (ref 65–179)
Glucose, 2 hour: 84 mg/dL (ref 65–152)
Glucose, Fasting: 76 mg/dL (ref 65–91)

## 2016-03-15 LAB — HIV ANTIBODY (ROUTINE TESTING W REFLEX): HIV Screen 4th Generation wRfx: NONREACTIVE

## 2016-03-15 LAB — RPR: RPR Ser Ql: NONREACTIVE

## 2016-03-28 ENCOUNTER — Ambulatory Visit (INDEPENDENT_AMBULATORY_CARE_PROVIDER_SITE_OTHER): Payer: BLUE CROSS/BLUE SHIELD | Admitting: Obstetrics and Gynecology

## 2016-03-28 VITALS — BP 115/77 | HR 87 | Wt 127.0 lb

## 2016-03-28 DIAGNOSIS — Z3483 Encounter for supervision of other normal pregnancy, third trimester: Secondary | ICD-10-CM

## 2016-03-28 DIAGNOSIS — Z3480 Encounter for supervision of other normal pregnancy, unspecified trimester: Secondary | ICD-10-CM

## 2016-03-28 NOTE — Patient Instructions (Signed)
Third Trimester of Pregnancy The third trimester is from week 28 through week 40 (months 7 through 9). The third trimester is a time when the unborn baby (fetus) is growing rapidly. At the end of the ninth month, the fetus is about 20 inches in length and weighs 6-10 pounds. Body changes during your third trimester Your body will continue to go through many changes during pregnancy. The changes vary from woman to woman. During the third trimester:  Your weight will continue to increase. You can expect to gain 25-35 pounds (11-16 kg) by the end of the pregnancy.  You may begin to get stretch marks on your hips, abdomen, and breasts.  You may urinate more often because the fetus is moving lower into your pelvis and pressing on your bladder.  You may develop or continue to have heartburn. This is caused by increased hormones that slow down muscles in the digestive tract.  You may develop or continue to have constipation because increased hormones slow digestion and cause the muscles that push waste through your intestines to relax.  You may develop hemorrhoids. These are swollen veins (varicose veins) in the rectum that can itch or be painful.  You may develop swollen, bulging veins (varicose veins) in your legs.  You may have increased body aches in the pelvis, back, or thighs. This is due to weight gain and increased hormones that are relaxing your joints.  You may have changes in your hair. These can include thickening of your hair, rapid growth, and changes in texture. Some women also have hair loss during or after pregnancy, or hair that feels dry or thin. Your hair will most likely return to normal after your baby is born.  Your breasts will continue to grow and they will continue to become tender. A yellow fluid (colostrum) may leak from your breasts. This is the first milk you are producing for your baby.  Your belly button may stick out.  You may notice more swelling in your hands,  face, or ankles.  You may have increased tingling or numbness in your hands, arms, and legs. The skin on your belly may also feel numb.  You may feel short of breath because of your expanding uterus.  You may have more problems sleeping. This can be caused by the size of your belly, increased need to urinate, and an increase in your body's metabolism.  You may notice the fetus "dropping," or moving lower in your abdomen (lightening).  You may have increased vaginal discharge.  You may notice your joints feel loose and you may have pain around your pelvic bone.  What to expect at prenatal visits You will have prenatal exams every 2 weeks until week 36. Then you will have weekly prenatal exams. During a routine prenatal visit:  You will be weighed to make sure you and the baby are growing normally.  Your blood pressure will be taken.  Your abdomen will be measured to track your baby's growth.  The fetal heartbeat will be listened to.  Any test results from the previous visit will be discussed.  You may have a cervical check near your due date to see if your cervix has softened or thinned (effaced).  You will be tested for Group B streptococcus. This happens between 35 and 37 weeks.  Your health care provider may ask you:  What your birth plan is.  How you are feeling.  If you are feeling the baby move.  If you have had   any abnormal symptoms, such as leaking fluid, bleeding, severe headaches, or abdominal cramping.  If you are using any tobacco products, including cigarettes, chewing tobacco, and electronic cigarettes.  If you have any questions.  Other tests or screenings that may be performed during your third trimester include:  Blood tests that check for low iron levels (anemia).  Fetal testing to check the health, activity level, and growth of the fetus. Testing is done if you have certain medical conditions or if there are problems during the  pregnancy.  Nonstress test (NST). This test checks the health of your baby to make sure there are no signs of problems, such as the baby not getting enough oxygen. During this test, a belt is placed around your belly. The baby is made to move, and its heart rate is monitored during movement.  What is false labor? False labor is a condition in which you feel small, irregular tightenings of the muscles in the womb (contractions) that usually go away with rest, changing position, or drinking water. These are called Braxton Hicks contractions. Contractions may last for hours, days, or even weeks before true labor sets in. If contractions come at regular intervals, become more frequent, increase in intensity, or become painful, you should see your health care provider. What are the signs of labor?  Abdominal cramps.  Regular contractions that start at 10 minutes apart and become stronger and more frequent with time.  Contractions that start on the top of the uterus and spread down to the lower abdomen and back.  Increased pelvic pressure and dull back pain.  A watery or bloody mucus discharge that comes from the vagina.  Leaking of amniotic fluid. This is also known as your "water breaking." It could be a slow trickle or a gush. Let your health care provider know if it has a color or strange odor. If you have any of these signs, call your health care provider right away, even if it is before your due date. Follow these instructions at home: Medicines  Follow your health care provider's instructions regarding medicine use. Specific medicines may be either safe or unsafe to take during pregnancy.  Take a prenatal vitamin that contains at least 600 micrograms (mcg) of folic acid.  If you develop constipation, try taking a stool softener if your health care provider approves. Eating and drinking  Eat a balanced diet that includes fresh fruits and vegetables, whole grains, good sources of protein  such as meat, eggs, or tofu, and low-fat dairy. Your health care provider will help you determine the amount of weight gain that is right for you.  Avoid raw meat and uncooked cheese. These carry germs that can cause birth defects in the baby.  If you have low calcium intake from food, talk to your health care provider about whether you should take a daily calcium supplement.  Eat four or five small meals rather than three large meals a day.  Limit foods that are high in fat and processed sugars, such as fried and sweet foods.  To prevent constipation: ? Drink enough fluid to keep your urine clear or pale yellow. ? Eat foods that are high in fiber, such as fresh fruits and vegetables, whole grains, and beans. Activity  Exercise only as directed by your health care provider. Most women can continue their usual exercise routine during pregnancy. Try to exercise for 30 minutes at least 5 days a week. Stop exercising if you experience uterine contractions.  Avoid heavy   lifting.  Do not exercise in extreme heat or humidity, or at high altitudes.  Wear low-heel, comfortable shoes.  Practice good posture.  You may continue to have sex unless your health care provider tells you otherwise. Relieving pain and discomfort  Take frequent breaks and rest with your legs elevated if you have leg cramps or low back pain.  Take warm sitz baths to soothe any pain or discomfort caused by hemorrhoids. Use hemorrhoid cream if your health care provider approves.  Wear a good support bra to prevent discomfort from breast tenderness.  If you develop varicose veins: ? Wear support pantyhose or compression stockings as told by your healthcare provider. ? Elevate your feet for 15 minutes, 3-4 times a day. Prenatal care  Write down your questions. Take them to your prenatal visits.  Keep all your prenatal visits as told by your health care provider. This is important. Safety  Wear your seat belt at  all times when driving.  Make a list of emergency phone numbers, including numbers for family, friends, the hospital, and police and fire departments. General instructions  Avoid cat litter boxes and soil used by cats. These carry germs that can cause birth defects in the baby. If you have a cat, ask someone to clean the litter box for you.  Do not travel far distances unless it is absolutely necessary and only with the approval of your health care provider.  Do not use hot tubs, steam rooms, or saunas.  Do not drink alcohol.  Do not use any products that contain nicotine or tobacco, such as cigarettes and e-cigarettes. If you need help quitting, ask your health care provider.  Do not use any medicinal herbs or unprescribed drugs. These chemicals affect the formation and growth of the baby.  Do not douche or use tampons or scented sanitary pads.  Do not cross your legs for long periods of time.  To prepare for the arrival of your baby: ? Take prenatal classes to understand, practice, and ask questions about labor and delivery. ? Make a trial run to the hospital. ? Visit the hospital and tour the maternity area. ? Arrange for maternity or paternity leave through employers. ? Arrange for family and friends to take care of pets while you are in the hospital. ? Purchase a rear-facing car seat and make sure you know how to install it in your car. ? Pack your hospital bag. ? Prepare the baby's nursery. Make sure to remove all pillows and stuffed animals from the baby's crib to prevent suffocation.  Visit your dentist if you have not gone during your pregnancy. Use a soft toothbrush to brush your teeth and be gentle when you floss. Contact a health care provider if:  You are unsure if you are in labor or if your water has broken.  You become dizzy.  You have mild pelvic cramps, pelvic pressure, or nagging pain in your abdominal area.  You have lower back pain.  You have persistent  nausea, vomiting, or diarrhea.  You have an unusual or bad smelling vaginal discharge.  You have pain when you urinate. Get help right away if:  Your water breaks before 37 weeks.  You have regular contractions less than 5 minutes apart before 37 weeks.  You have a fever.  You are leaking fluid from your vagina.  You have spotting or bleeding from your vagina.  You have severe abdominal pain or cramping.  You have rapid weight loss or weight gain.    You have shortness of breath with chest pain.  You notice sudden or extreme swelling of your face, hands, ankles, feet, or legs.  Your baby makes fewer than 10 movements in 2 hours.  You have severe headaches that do not go away when you take medicine.  You have vision changes. Summary  The third trimester is from week 28 through week 40, months 7 through 9. The third trimester is a time when the unborn baby (fetus) is growing rapidly.  During the third trimester, your discomfort may increase as you and your baby continue to gain weight. You may have abdominal, leg, and back pain, sleeping problems, and an increased need to urinate.  During the third trimester your breasts will keep growing and they will continue to become tender. A yellow fluid (colostrum) may leak from your breasts. This is the first milk you are producing for your baby.  False labor is a condition in which you feel small, irregular tightenings of the muscles in the womb (contractions) that eventually go away. These are called Braxton Hicks contractions. Contractions may last for hours, days, or even weeks before true labor sets in.  Signs of labor can include: abdominal cramps; regular contractions that start at 10 minutes apart and become stronger and more frequent with time; watery or bloody mucus discharge that comes from the vagina; increased pelvic pressure and dull back pain; and leaking of amniotic fluid. This information is not intended to replace advice  given to you by your health care provider. Make sure you discuss any questions you have with your health care provider. Document Released: 12/19/2000 Document Revised: 06/02/2015 Document Reviewed: 02/26/2012 Elsevier Interactive Patient Education  2017 [employer]  

## 2016-03-28 NOTE — Progress Notes (Signed)
Subjective:  Melissa Thomas is a 36 y.o. G2P1001 at 2249w3d being seen today for ongoing prenatal care.  She is currently monitored for the following issues for this low-risk pregnancy and has Supervision of normal pregnancy, antepartum on her problem list.  Patient reports some general discomforts of pregnancy.  Contractions: Not present. Vag. Bleeding: None.  Movement: Present. Denies leaking of fluid.   The following portions of the patient's history were reviewed and updated as appropriate: allergies, current medications, past family history, past medical history, past social history, past surgical history and problem list. Problem list updated.  Objective:   Vitals:   03/28/16 1535  BP: 115/77  Pulse: 87  Weight: 127 lb (57.6 kg)    Fetal Status: Fetal Heart Rate (bpm): 150   Movement: Present     General:  Alert, oriented and cooperative. Patient is in no acute distress.  Skin: Skin is warm and dry. No rash noted.   Cardiovascular: Normal heart rate noted  Respiratory: Normal respiratory effort, no problems with respiration noted  Abdomen: Soft, gravid, appropriate for gestational age. Pain/Pressure: Present     Pelvic:  Cervical exam deferred        Extremities: Normal range of motion.     Mental Status: Normal mood and affect. Normal behavior. Normal judgment and thought content.   Urinalysis:      Assessment and Plan:  Pregnancy: G2P1001 at 8249w3d  1. Supervision of other normal pregnancy, antepartum Stable Undecided about contraception. Breast/Bottle  Preterm labor symptoms and general obstetric precautions including but not limited to vaginal bleeding, contractions, leaking of fluid and fetal movement were reviewed in detail with the patient. Please refer to After Visit Summary for other counseling recommendations.  Return in about 2 weeks (around 04/11/2016) for OB visit.   Hermina StaggersMichael L Ervin, MD

## 2016-03-28 NOTE — Progress Notes (Signed)
Pt states having pelvic heaviness.

## 2016-04-11 ENCOUNTER — Encounter: Payer: Self-pay | Admitting: Obstetrics & Gynecology

## 2016-04-11 ENCOUNTER — Ambulatory Visit (INDEPENDENT_AMBULATORY_CARE_PROVIDER_SITE_OTHER): Payer: BLUE CROSS/BLUE SHIELD | Admitting: Obstetrics & Gynecology

## 2016-04-11 DIAGNOSIS — Z3480 Encounter for supervision of other normal pregnancy, unspecified trimester: Secondary | ICD-10-CM

## 2016-04-11 NOTE — Progress Notes (Signed)
   PRENATAL VISIT NOTE  Subjective:  Melissa Thomas is a 36 y.o. G2P1001 at [redacted]w[redacted]d being seen today for ongoing prenatal care.  She is currently monitored for the following issues for this low-risk pregnancy and has Supervision of normal pregnancy, antepartum on her problem list.  Patient reports no complaints.  Contractions: Not present. Vag. Bleeding: None.  Movement: Present. Denies leaking of fluid.   The following portions of the patient's history were reviewed and updated as appropriate: allergies, current medications, past family history, past medical history, past social history, past surgical history and problem list. Problem list updated.  Objective:   Vitals:   04/11/16 1617  BP: 114/74  Pulse: 96  Weight: 127 lb (57.6 kg)    Fetal Status: Fetal Heart Rate (bpm): 155   Movement: Present     General:  Alert, oriented and cooperative. Patient is in no acute distress.  Skin: Skin is warm and dry. No rash noted.   Cardiovascular: Normal heart rate noted  Respiratory: Normal respiratory effort, no problems with respiration noted  Abdomen: Soft, gravid, appropriate for gestational age. Pain/Pressure: Present     Pelvic:  Cervical exam deferred        Extremities: Normal range of motion.  Edema: None  Mental Status: Normal mood and affect. Normal behavior. Normal judgment and thought content.   Assessment and Plan:  Pregnancy: G2P1001 at [redacted]w[redacted]d  1. Supervision of other normal pregnancy, antepartum Had some pelvic pain a few days ago which resolved  Preterm labor symptoms and general obstetric precautions including but not limited to vaginal bleeding, contractions, leaking of fluid and fetal movement were reviewed in detail with the patient. Please refer to After Visit Summary for other counseling recommendations.  Return in about 2 weeks (around 04/25/2016).   Adam Phenix, MD

## 2016-04-11 NOTE — Progress Notes (Signed)
Pt presents for ROB. Pt also c/o L lower back pain radiating down to her foot.

## 2016-04-11 NOTE — Patient Instructions (Signed)
Third Trimester of Pregnancy The third trimester is from week 28 through week 40 (months 7 through 9). The third trimester is a time when the unborn baby (fetus) is growing rapidly. At the end of the ninth month, the fetus is about 20 inches in length and weighs 6-10 pounds. Body changes during your third trimester Your body will continue to go through many changes during pregnancy. The changes vary from woman to woman. During the third trimester:  Your weight will continue to increase. You can expect to gain 25-35 pounds (11-16 kg) by the end of the pregnancy.  You may begin to get stretch marks on your hips, abdomen, and breasts.  You may urinate more often because the fetus is moving lower into your pelvis and pressing on your bladder.  You may develop or continue to have heartburn. This is caused by increased hormones that slow down muscles in the digestive tract.  You may develop or continue to have constipation because increased hormones slow digestion and cause the muscles that push waste through your intestines to relax.  You may develop hemorrhoids. These are swollen veins (varicose veins) in the rectum that can itch or be painful.  You may develop swollen, bulging veins (varicose veins) in your legs.  You may have increased body aches in the pelvis, back, or thighs. This is due to weight gain and increased hormones that are relaxing your joints.  You may have changes in your hair. These can include thickening of your hair, rapid growth, and changes in texture. Some women also have hair loss during or after pregnancy, or hair that feels dry or thin. Your hair will most likely return to normal after your baby is born.  Your breasts will continue to grow and they will continue to become tender. A yellow fluid (colostrum) may leak from your breasts. This is the first milk you are producing for your baby.  Your belly button may stick out.  You may notice more swelling in your hands,  face, or ankles.  You may have increased tingling or numbness in your hands, arms, and legs. The skin on your belly may also feel numb.  You may feel short of breath because of your expanding uterus.  You may have more problems sleeping. This can be caused by the size of your belly, increased need to urinate, and an increase in your body's metabolism.  You may notice the fetus "dropping," or moving lower in your abdomen (lightening).  You may have increased vaginal discharge.  You may notice your joints feel loose and you may have pain around your pelvic bone.  What to expect at prenatal visits You will have prenatal exams every 2 weeks until week 36. Then you will have weekly prenatal exams. During a routine prenatal visit:  You will be weighed to make sure you and the baby are growing normally.  Your blood pressure will be taken.  Your abdomen will be measured to track your baby's growth.  The fetal heartbeat will be listened to.  Any test results from the previous visit will be discussed.  You may have a cervical check near your due date to see if your cervix has softened or thinned (effaced).  You will be tested for Group B streptococcus. This happens between 35 and 37 weeks.  Your health care provider may ask you:  What your birth plan is.  How you are feeling.  If you are feeling the baby move.  If you have had   any abnormal symptoms, such as leaking fluid, bleeding, severe headaches, or abdominal cramping.  If you are using any tobacco products, including cigarettes, chewing tobacco, and electronic cigarettes.  If you have any questions.  Other tests or screenings that may be performed during your third trimester include:  Blood tests that check for low iron levels (anemia).  Fetal testing to check the health, activity level, and growth of the fetus. Testing is done if you have certain medical conditions or if there are problems during the  pregnancy.  Nonstress test (NST). This test checks the health of your baby to make sure there are no signs of problems, such as the baby not getting enough oxygen. During this test, a belt is placed around your belly. The baby is made to move, and its heart rate is monitored during movement.  What is false labor? False labor is a condition in which you feel small, irregular tightenings of the muscles in the womb (contractions) that usually go away with rest, changing position, or drinking water. These are called Braxton Hicks contractions. Contractions may last for hours, days, or even weeks before true labor sets in. If contractions come at regular intervals, become more frequent, increase in intensity, or become painful, you should see your health care provider. What are the signs of labor?  Abdominal cramps.  Regular contractions that start at 10 minutes apart and become stronger and more frequent with time.  Contractions that start on the top of the uterus and spread down to the lower abdomen and back.  Increased pelvic pressure and dull back pain.  A watery or bloody mucus discharge that comes from the vagina.  Leaking of amniotic fluid. This is also known as your "water breaking." It could be a slow trickle or a gush. Let your health care provider know if it has a color or strange odor. If you have any of these signs, call your health care provider right away, even if it is before your due date. Follow these instructions at home: Medicines  Follow your health care provider's instructions regarding medicine use. Specific medicines may be either safe or unsafe to take during pregnancy.  Take a prenatal vitamin that contains at least 600 micrograms (mcg) of folic acid.  If you develop constipation, try taking a stool softener if your health care provider approves. Eating and drinking  Eat a balanced diet that includes fresh fruits and vegetables, whole grains, good sources of protein  such as meat, eggs, or tofu, and low-fat dairy. Your health care provider will help you determine the amount of weight gain that is right for you.  Avoid raw meat and uncooked cheese. These carry germs that can cause birth defects in the baby.  If you have low calcium intake from food, talk to your health care provider about whether you should take a daily calcium supplement.  Eat four or five small meals rather than three large meals a day.  Limit foods that are high in fat and processed sugars, such as fried and sweet foods.  To prevent constipation: ? Drink enough fluid to keep your urine clear or pale yellow. ? Eat foods that are high in fiber, such as fresh fruits and vegetables, whole grains, and beans. Activity  Exercise only as directed by your health care provider. Most women can continue their usual exercise routine during pregnancy. Try to exercise for 30 minutes at least 5 days a week. Stop exercising if you experience uterine contractions.  Avoid heavy   lifting.  Do not exercise in extreme heat or humidity, or at high altitudes.  Wear low-heel, comfortable shoes.  Practice good posture.  You may continue to have sex unless your health care provider tells you otherwise. Relieving pain and discomfort  Take frequent breaks and rest with your legs elevated if you have leg cramps or low back pain.  Take warm sitz baths to soothe any pain or discomfort caused by hemorrhoids. Use hemorrhoid cream if your health care provider approves.  Wear a good support bra to prevent discomfort from breast tenderness.  If you develop varicose veins: ? Wear support pantyhose or compression stockings as told by your healthcare provider. ? Elevate your feet for 15 minutes, 3-4 times a day. Prenatal care  Write down your questions. Take them to your prenatal visits.  Keep all your prenatal visits as told by your health care provider. This is important. Safety  Wear your seat belt at  all times when driving.  Make a list of emergency phone numbers, including numbers for family, friends, the hospital, and police and fire departments. General instructions  Avoid cat litter boxes and soil used by cats. These carry germs that can cause birth defects in the baby. If you have a cat, ask someone to clean the litter box for you.  Do not travel far distances unless it is absolutely necessary and only with the approval of your health care provider.  Do not use hot tubs, steam rooms, or saunas.  Do not drink alcohol.  Do not use any products that contain nicotine or tobacco, such as cigarettes and e-cigarettes. If you need help quitting, ask your health care provider.  Do not use any medicinal herbs or unprescribed drugs. These chemicals affect the formation and growth of the baby.  Do not douche or use tampons or scented sanitary pads.  Do not cross your legs for long periods of time.  To prepare for the arrival of your baby: ? Take prenatal classes to understand, practice, and ask questions about labor and delivery. ? Make a trial run to the hospital. ? Visit the hospital and tour the maternity area. ? Arrange for maternity or paternity leave through employers. ? Arrange for family and friends to take care of pets while you are in the hospital. ? Purchase a rear-facing car seat and make sure you know how to install it in your car. ? Pack your hospital bag. ? Prepare the baby's nursery. Make sure to remove all pillows and stuffed animals from the baby's crib to prevent suffocation.  Visit your dentist if you have not gone during your pregnancy. Use a soft toothbrush to brush your teeth and be gentle when you floss. Contact a health care provider if:  You are unsure if you are in labor or if your water has broken.  You become dizzy.  You have mild pelvic cramps, pelvic pressure, or nagging pain in your abdominal area.  You have lower back pain.  You have persistent  nausea, vomiting, or diarrhea.  You have an unusual or bad smelling vaginal discharge.  You have pain when you urinate. Get help right away if:  Your water breaks before 37 weeks.  You have regular contractions less than 5 minutes apart before 37 weeks.  You have a fever.  You are leaking fluid from your vagina.  You have spotting or bleeding from your vagina.  You have severe abdominal pain or cramping.  You have rapid weight loss or weight gain.    You have shortness of breath with chest pain.  You notice sudden or extreme swelling of your face, hands, ankles, feet, or legs.  Your baby makes fewer than 10 movements in 2 hours.  You have severe headaches that do not go away when you take medicine.  You have vision changes. Summary  The third trimester is from week 28 through week 40, months 7 through 9. The third trimester is a time when the unborn baby (fetus) is growing rapidly.  During the third trimester, your discomfort may increase as you and your baby continue to gain weight. You may have abdominal, leg, and back pain, sleeping problems, and an increased need to urinate.  During the third trimester your breasts will keep growing and they will continue to become tender. A yellow fluid (colostrum) may leak from your breasts. This is the first milk you are producing for your baby.  False labor is a condition in which you feel small, irregular tightenings of the muscles in the womb (contractions) that eventually go away. These are called Braxton Hicks contractions. Contractions may last for hours, days, or even weeks before true labor sets in.  Signs of labor can include: abdominal cramps; regular contractions that start at 10 minutes apart and become stronger and more frequent with time; watery or bloody mucus discharge that comes from the vagina; increased pelvic pressure and dull back pain; and leaking of amniotic fluid. This information is not intended to replace advice  given to you by your health care provider. Make sure you discuss any questions you have with your health care provider. Document Released: 12/19/2000 Document Revised: 06/02/2015 Document Reviewed: 02/26/2012 Elsevier Interactive Patient Education  2017 [employer]  

## 2016-04-23 ENCOUNTER — Ambulatory Visit (INDEPENDENT_AMBULATORY_CARE_PROVIDER_SITE_OTHER): Payer: BLUE CROSS/BLUE SHIELD | Admitting: Obstetrics and Gynecology

## 2016-04-23 VITALS — BP 120/80 | HR 97 | Wt 128.0 lb

## 2016-04-23 DIAGNOSIS — R310 Gross hematuria: Secondary | ICD-10-CM

## 2016-04-23 DIAGNOSIS — Z3480 Encounter for supervision of other normal pregnancy, unspecified trimester: Secondary | ICD-10-CM

## 2016-04-23 DIAGNOSIS — Z3483 Encounter for supervision of other normal pregnancy, third trimester: Secondary | ICD-10-CM

## 2016-04-23 LAB — POCT URINALYSIS DIPSTICK
Bilirubin, UA: NEGATIVE
Glucose, UA: NEGATIVE
NITRITE UA: NEGATIVE
PH UA: 6.5 (ref 5.0–8.0)
PROTEIN UA: 1
Spec Grav, UA: 1.01 (ref 1.010–1.025)
UROBILINOGEN UA: NEGATIVE U/dL — AB

## 2016-04-23 NOTE — Progress Notes (Signed)
Patient thinks she has blood in her urine.

## 2016-04-23 NOTE — Progress Notes (Signed)
   PRENATAL VISIT NOTE  Subjective:  Melissa Thomas is a 36 y.o. G2P1001 at [redacted]w[redacted]d being seen today for ongoing prenatal care.  She is currently monitored for the following issues for this low-risk pregnancy and has Supervision of normal pregnancy, antepartum on her problem list.  Patient reports no complaints.  Contractions: Not present. Vag. Bleeding: None.  Movement: Present. Denies leaking of fluid.   The following portions of the patient's history were reviewed and updated as appropriate: allergies, current medications, past family history, past medical history, past social history, past surgical history and problem list. Problem list updated.  Objective:   Vitals:   04/23/16 1540  BP: 120/80  Pulse: 97  Weight: 128 lb (58.1 kg)    Fetal Status: Fetal Heart Rate (bpm): 141 Fundal Height: 34 cm Movement: Present     General:  Alert, oriented and cooperative. Patient is in no acute distress.  Skin: Skin is warm and dry. No rash noted.   Cardiovascular: Normal heart rate noted  Respiratory: Normal respiratory effort, no problems with respiration noted  Abdomen: Soft, gravid, appropriate for gestational age. Pain/Pressure: Present     Pelvic:  Cervical exam deferred        Extremities: Normal range of motion.  Edema: None  Mental Status: Normal mood and affect. Normal behavior. Normal judgment and thought content.   Assessment and Plan:  Pregnancy: G2P1001 at [redacted]w[redacted]d  1. Gross hematuria Patient denies dysuria, frequency or urgency. She denies flank pain Urine culture ordered - POCT urinalysis dipstick - Culture, OB Urine  2. Supervision of other normal pregnancy, antepartum Patient is doing well without complaints - Culture, OB Urine  Preterm labor symptoms and general obstetric precautions including but not limited to vaginal bleeding, contractions, leaking of fluid and fetal movement were reviewed in detail with the patient. Please refer to After Visit Summary for other  counseling recommendations.  Return in about 2 weeks (around 05/07/2016) for ROB with cultures.   Catalina Antigua, MD

## 2016-04-27 LAB — URINE CULTURE, OB REFLEX

## 2016-04-27 LAB — CULTURE, OB URINE

## 2016-05-08 ENCOUNTER — Ambulatory Visit (INDEPENDENT_AMBULATORY_CARE_PROVIDER_SITE_OTHER): Payer: BLUE CROSS/BLUE SHIELD | Admitting: Obstetrics and Gynecology

## 2016-05-08 VITALS — BP 136/87 | HR 73 | Wt 129.6 lb

## 2016-05-08 DIAGNOSIS — Z113 Encounter for screening for infections with a predominantly sexual mode of transmission: Secondary | ICD-10-CM | POA: Diagnosis not present

## 2016-05-08 DIAGNOSIS — Z3480 Encounter for supervision of other normal pregnancy, unspecified trimester: Secondary | ICD-10-CM | POA: Diagnosis not present

## 2016-05-08 DIAGNOSIS — Z3483 Encounter for supervision of other normal pregnancy, third trimester: Secondary | ICD-10-CM

## 2016-05-08 NOTE — Patient Instructions (Signed)
Third Trimester of Pregnancy The third trimester is from week 28 through week 40 (months 7 through 9). The third trimester is a time when the unborn baby (fetus) is growing rapidly. At the end of the ninth month, the fetus is about 20 inches in length and weighs 6-10 pounds. Body changes during your third trimester Your body will continue to go through many changes during pregnancy. The changes vary from woman to woman. During the third trimester:  Your weight will continue to increase. You can expect to gain 25-35 pounds (11-16 kg) by the end of the pregnancy.  You may begin to get stretch marks on your hips, abdomen, and breasts.  You may urinate more often because the fetus is moving lower into your pelvis and pressing on your bladder.  You may develop or continue to have heartburn. This is caused by increased hormones that slow down muscles in the digestive tract.  You may develop or continue to have constipation because increased hormones slow digestion and cause the muscles that push waste through your intestines to relax.  You may develop hemorrhoids. These are swollen veins (varicose veins) in the rectum that can itch or be painful.  You may develop swollen, bulging veins (varicose veins) in your legs.  You may have increased body aches in the pelvis, back, or thighs. This is due to weight gain and increased hormones that are relaxing your joints.  You may have changes in your hair. These can include thickening of your hair, rapid growth, and changes in texture. Some women also have hair loss during or after pregnancy, or hair that feels dry or thin. Your hair will most likely return to normal after your baby is born.  Your breasts will continue to grow and they will continue to become tender. A yellow fluid (colostrum) may leak from your breasts. This is the first milk you are producing for your baby.  Your belly button may stick out.  You may notice more swelling in your hands,  face, or ankles.  You may have increased tingling or numbness in your hands, arms, and legs. The skin on your belly may also feel numb.  You may feel short of breath because of your expanding uterus.  You may have more problems sleeping. This can be caused by the size of your belly, increased need to urinate, and an increase in your body's metabolism.  You may notice the fetus "dropping," or moving lower in your abdomen (lightening).  You may have increased vaginal discharge.  You may notice your joints feel loose and you may have pain around your pelvic bone.  What to expect at prenatal visits You will have prenatal exams every 2 weeks until week 36. Then you will have weekly prenatal exams. During a routine prenatal visit:  You will be weighed to make sure you and the baby are growing normally.  Your blood pressure will be taken.  Your abdomen will be measured to track your baby's growth.  The fetal heartbeat will be listened to.  Any test results from the previous visit will be discussed.  You may have a cervical check near your due date to see if your cervix has softened or thinned (effaced).  You will be tested for Group B streptococcus. This happens between 35 and 37 weeks.  Your health care provider may ask you:  What your birth plan is.  How you are feeling.  If you are feeling the baby move.  If you have had   any abnormal symptoms, such as leaking fluid, bleeding, severe headaches, or abdominal cramping.  If you are using any tobacco products, including cigarettes, chewing tobacco, and electronic cigarettes.  If you have any questions.  Other tests or screenings that may be performed during your third trimester include:  Blood tests that check for low iron levels (anemia).  Fetal testing to check the health, activity level, and growth of the fetus. Testing is done if you have certain medical conditions or if there are problems during the  pregnancy.  Nonstress test (NST). This test checks the health of your baby to make sure there are no signs of problems, such as the baby not getting enough oxygen. During this test, a belt is placed around your belly. The baby is made to move, and its heart rate is monitored during movement.  What is false labor? False labor is a condition in which you feel small, irregular tightenings of the muscles in the womb (contractions) that usually go away with rest, changing position, or drinking water. These are called Braxton Hicks contractions. Contractions may last for hours, days, or even weeks before true labor sets in. If contractions come at regular intervals, become more frequent, increase in intensity, or become painful, you should see your health care provider. What are the signs of labor?  Abdominal cramps.  Regular contractions that start at 10 minutes apart and become stronger and more frequent with time.  Contractions that start on the top of the uterus and spread down to the lower abdomen and back.  Increased pelvic pressure and dull back pain.  A watery or bloody mucus discharge that comes from the vagina.  Leaking of amniotic fluid. This is also known as your "water breaking." It could be a slow trickle or a gush. Let your health care provider know if it has a color or strange odor. If you have any of these signs, call your health care provider right away, even if it is before your due date. Follow these instructions at home: Medicines  Follow your health care provider's instructions regarding medicine use. Specific medicines may be either safe or unsafe to take during pregnancy.  Take a prenatal vitamin that contains at least 600 micrograms (mcg) of folic acid.  If you develop constipation, try taking a stool softener if your health care provider approves. Eating and drinking  Eat a balanced diet that includes fresh fruits and vegetables, whole grains, good sources of protein  such as meat, eggs, or tofu, and low-fat dairy. Your health care provider will help you determine the amount of weight gain that is right for you.  Avoid raw meat and uncooked cheese. These carry germs that can cause birth defects in the baby.  If you have low calcium intake from food, talk to your health care provider about whether you should take a daily calcium supplement.  Eat four or five small meals rather than three large meals a day.  Limit foods that are high in fat and processed sugars, such as fried and sweet foods.  To prevent constipation: ? Drink enough fluid to keep your urine clear or pale yellow. ? Eat foods that are high in fiber, such as fresh fruits and vegetables, whole grains, and beans. Activity  Exercise only as directed by your health care provider. Most women can continue their usual exercise routine during pregnancy. Try to exercise for 30 minutes at least 5 days a week. Stop exercising if you experience uterine contractions.  Avoid heavy   lifting.  Do not exercise in extreme heat or humidity, or at high altitudes.  Wear low-heel, comfortable shoes.  Practice good posture.  You may continue to have sex unless your health care provider tells you otherwise. Relieving pain and discomfort  Take frequent breaks and rest with your legs elevated if you have leg cramps or low back pain.  Take warm sitz baths to soothe any pain or discomfort caused by hemorrhoids. Use hemorrhoid cream if your health care provider approves.  Wear a good support bra to prevent discomfort from breast tenderness.  If you develop varicose veins: ? Wear support pantyhose or compression stockings as told by your healthcare provider. ? Elevate your feet for 15 minutes, 3-4 times a day. Prenatal care  Write down your questions. Take them to your prenatal visits.  Keep all your prenatal visits as told by your health care provider. This is important. Safety  Wear your seat belt at  all times when driving.  Make a list of emergency phone numbers, including numbers for family, friends, the hospital, and police and fire departments. General instructions  Avoid cat litter boxes and soil used by cats. These carry germs that can cause birth defects in the baby. If you have a cat, ask someone to clean the litter box for you.  Do not travel far distances unless it is absolutely necessary and only with the approval of your health care provider.  Do not use hot tubs, steam rooms, or saunas.  Do not drink alcohol.  Do not use any products that contain nicotine or tobacco, such as cigarettes and e-cigarettes. If you need help quitting, ask your health care provider.  Do not use any medicinal herbs or unprescribed drugs. These chemicals affect the formation and growth of the baby.  Do not douche or use tampons or scented sanitary pads.  Do not cross your legs for long periods of time.  To prepare for the arrival of your baby: ? Take prenatal classes to understand, practice, and ask questions about labor and delivery. ? Make a trial run to the hospital. ? Visit the hospital and tour the maternity area. ? Arrange for maternity or paternity leave through employers. ? Arrange for family and friends to take care of pets while you are in the hospital. ? Purchase a rear-facing car seat and make sure you know how to install it in your car. ? Pack your hospital bag. ? Prepare the baby's nursery. Make sure to remove all pillows and stuffed animals from the baby's crib to prevent suffocation.  Visit your dentist if you have not gone during your pregnancy. Use a soft toothbrush to brush your teeth and be gentle when you floss. Contact a health care provider if:  You are unsure if you are in labor or if your water has broken.  You become dizzy.  You have mild pelvic cramps, pelvic pressure, or nagging pain in your abdominal area.  You have lower back pain.  You have persistent  nausea, vomiting, or diarrhea.  You have an unusual or bad smelling vaginal discharge.  You have pain when you urinate. Get help right away if:  Your water breaks before 37 weeks.  You have regular contractions less than 5 minutes apart before 37 weeks.  You have a fever.  You are leaking fluid from your vagina.  You have spotting or bleeding from your vagina.  You have severe abdominal pain or cramping.  You have rapid weight loss or weight gain.    You have shortness of breath with chest pain.  You notice sudden or extreme swelling of your face, hands, ankles, feet, or legs.  Your baby makes fewer than 10 movements in 2 hours.  You have severe headaches that do not go away when you take medicine.  You have vision changes. Summary  The third trimester is from week 28 through week 40, months 7 through 9. The third trimester is a time when the unborn baby (fetus) is growing rapidly.  During the third trimester, your discomfort may increase as you and your baby continue to gain weight. You may have abdominal, leg, and back pain, sleeping problems, and an increased need to urinate.  During the third trimester your breasts will keep growing and they will continue to become tender. A yellow fluid (colostrum) may leak from your breasts. This is the first milk you are producing for your baby.  False labor is a condition in which you feel small, irregular tightenings of the muscles in the womb (contractions) that eventually go away. These are called Braxton Hicks contractions. Contractions may last for hours, days, or even weeks before true labor sets in.  Signs of labor can include: abdominal cramps; regular contractions that start at 10 minutes apart and become stronger and more frequent with time; watery or bloody mucus discharge that comes from the vagina; increased pelvic pressure and dull back pain; and leaking of amniotic fluid. This information is not intended to replace advice  given to you by your health care provider. Make sure you discuss any questions you have with your health care provider. Document Released: 12/19/2000 Document Revised: 06/02/2015 Document Reviewed: 02/26/2012 Elsevier Interactive Patient Education  2017 [employer]  

## 2016-05-08 NOTE — Progress Notes (Signed)
Subjective:  Melissa Thomas is a 36 y.o. G2P1001 at [redacted]w[redacted]d being seen today for ongoing prenatal care.  She is currently monitored for the following issues for this low-risk pregnancy and has Supervision of normal pregnancy, antepartum on her problem list.  Patient reports some itching related to bug bites. .  Contractions: Not present. Vag. Bleeding: None.  Movement: Present. Denies leaking of fluid.   The following portions of the patient's history were reviewed and updated as appropriate: allergies, current medications, past family history, past medical history, past social history, past surgical history and problem list. Problem list updated.  Objective:   Vitals:   05/08/16 1616  BP: 136/87  Pulse: 73  Weight: 58.8 kg (129 lb 9.6 oz)    Fetal Status: Fetal Heart Rate (bpm): 148   Movement: Present     General:  Alert, oriented and cooperative. Patient is in no acute distress.  Skin: Skin is warm and dry. No rash noted.   Cardiovascular: Normal heart rate noted  Respiratory: Normal respiratory effort, no problems with respiration noted  Abdomen: Soft, gravid, appropriate for gestational age. Pain/Pressure: Present     Pelvic:  Cervical exam performed        Extremities: Normal range of motion.  Edema: Trace  Mental Status: Normal mood and affect. Normal behavior. Normal judgment and thought content.   Urinalysis:      Assessment and Plan:  Pregnancy: G2P1001 at [redacted]w[redacted]d  1. Supervision of other normal pregnancy, antepartum Labor precautions reviewed. Pt instructed to use Benadryl cream for itching. - Strep Gp B NAA - Cervicovaginal ancillary only  Preterm labor symptoms and general obstetric precautions including but not limited to vaginal bleeding, contractions, leaking of fluid and fetal movement were reviewed in detail with the patient. Please refer to After Visit Summary for other counseling recommendations.  Return in about 1 week (around 05/15/2016) for OB visit.   Hermina Staggers, MD

## 2016-05-08 NOTE — Progress Notes (Signed)
Patient complains of itching on her belly.

## 2016-05-09 LAB — CERVICOVAGINAL ANCILLARY ONLY
Chlamydia: NEGATIVE
Neisseria Gonorrhea: NEGATIVE

## 2016-05-09 LAB — OB RESULTS CONSOLE GBS: GBS: NEGATIVE

## 2016-05-10 LAB — STREP GP B NAA: STREP GROUP B AG: NEGATIVE

## 2016-05-14 ENCOUNTER — Ambulatory Visit (INDEPENDENT_AMBULATORY_CARE_PROVIDER_SITE_OTHER): Payer: BLUE CROSS/BLUE SHIELD | Admitting: Obstetrics and Gynecology

## 2016-05-14 VITALS — BP 124/82 | HR 90 | Wt 131.5 lb

## 2016-05-14 DIAGNOSIS — Z3480 Encounter for supervision of other normal pregnancy, unspecified trimester: Secondary | ICD-10-CM

## 2016-05-14 DIAGNOSIS — Z3483 Encounter for supervision of other normal pregnancy, third trimester: Secondary | ICD-10-CM

## 2016-05-14 NOTE — Progress Notes (Signed)
   PRENATAL VISIT NOTE  Subjective:  Melissa Thomas is a 36 y.o. G2P1001 at 4077w1d being seen today for ongoing prenatal care.  She is currently monitored for the following issues for this low-risk pregnancy and has Supervision of normal pregnancy, antepartum on her problem list.  Patient reports no complaints.  Contractions: Not present. Vag. Bleeding: None.  Movement: Present. Denies leaking of fluid.   The following portions of the patient's history were reviewed and updated as appropriate: allergies, current medications, past family history, past medical history, past social history, past surgical history and problem list. Problem list updated.  Objective:   Vitals:   05/14/16 1139  BP: 124/82  Pulse: 90  Weight: 131 lb 8 oz (59.6 kg)    Fetal Status: Fetal Heart Rate (bpm): 145 (Simultaneous filing. User may not have seen previous data.) Fundal Height: 37 cm Movement: Present     General:  Alert, oriented and cooperative. Patient is in no acute distress.  Skin: Skin is warm and dry. No rash noted.   Cardiovascular: Normal heart rate noted  Respiratory: Normal respiratory effort, no problems with respiration noted  Abdomen: Soft, gravid, appropriate for gestational age. Pain/Pressure: Absent     Pelvic:  Cervical exam deferred        Extremities: Normal range of motion.  Edema: Trace  Mental Status: Normal mood and affect. Normal behavior. Normal judgment and thought content.   Assessment and Plan:  Pregnancy: G2P1001 at 9777w1d  1. Supervision of other normal pregnancy, antepartum Patient is doing well without complaints Culture results reviewed with the patient  Term labor symptoms and general obstetric precautions including but not limited to vaginal bleeding, contractions, leaking of fluid and fetal movement were reviewed in detail with the patient. Please refer to After Visit Summary for other counseling recommendations.  Return in about 1 week (around 05/21/2016) for  ROB.   Keivon Garden, Gigi GinPeggy, MD

## 2016-05-21 ENCOUNTER — Ambulatory Visit (INDEPENDENT_AMBULATORY_CARE_PROVIDER_SITE_OTHER): Payer: BLUE CROSS/BLUE SHIELD | Admitting: Obstetrics and Gynecology

## 2016-05-21 VITALS — BP 122/85 | HR 97 | Wt 132.2 lb

## 2016-05-21 DIAGNOSIS — Z3480 Encounter for supervision of other normal pregnancy, unspecified trimester: Secondary | ICD-10-CM

## 2016-05-21 DIAGNOSIS — Z3483 Encounter for supervision of other normal pregnancy, third trimester: Secondary | ICD-10-CM

## 2016-05-21 NOTE — Progress Notes (Signed)
   PRENATAL VISIT NOTE  Subjective:  Melissa Thomas is a 36 y.o. G2P1001 at 2236w1d being seen today for ongoing prenatal care.  She is currently monitored for the following issues for this low-risk pregnancy and has Supervision of normal pregnancy, antepartum on her problem list.  Patient reports no complaints.  Contractions: Not present. Vag. Bleeding: None.  Movement: Present. Denies leaking of fluid.   The following portions of the patient's history were reviewed and updated as appropriate: allergies, current medications, past family history, past medical history, past social history, past surgical history and problem list. Problem list updated.  Objective:   Vitals:   05/21/16 1553  BP: 122/85  Pulse: 97  Weight: 132 lb 3.2 oz (60 kg)    Fetal Status: Fetal Heart Rate (bpm): 150 Fundal Height: 38 cm Movement: Present     General:  Alert, oriented and cooperative. Patient is in no acute distress.  Skin: Skin is warm and dry. No rash noted.   Cardiovascular: Normal heart rate noted  Respiratory: Normal respiratory effort, no problems with respiration noted  Abdomen: Soft, gravid, appropriate for gestational age. Pain/Pressure: Absent   Healing scab overlying 0.5 mm mole on left side of abdomen   Pelvic:  Cervical exam deferred        Extremities: Normal range of motion.  Edema: Trace  Mental Status: Normal mood and affect. Normal behavior. Normal judgment and thought content.   Assessment and Plan:  Pregnancy: G2P1001 at 7736w1d  1. Supervision of other normal pregnancy, antepartum Patient is doing well without complaints  Term labor symptoms and general obstetric precautions including but not limited to vaginal bleeding, contractions, leaking of fluid and fetal movement were reviewed in detail with the patient. Please refer to After Visit Summary for other counseling recommendations.  Return in about 1 week (around 05/28/2016) for ROB.   Neftali Thurow, Gigi GinPeggy, MD

## 2016-05-21 NOTE — Progress Notes (Signed)
Patient was having some groin pain. She also scratched the top of a mole on her side that she wants to have her doctor look at.

## 2016-05-22 ENCOUNTER — Encounter: Payer: BLUE CROSS/BLUE SHIELD | Admitting: Obstetrics & Gynecology

## 2016-05-28 ENCOUNTER — Ambulatory Visit (INDEPENDENT_AMBULATORY_CARE_PROVIDER_SITE_OTHER): Payer: BLUE CROSS/BLUE SHIELD | Admitting: Obstetrics and Gynecology

## 2016-05-28 DIAGNOSIS — Z3480 Encounter for supervision of other normal pregnancy, unspecified trimester: Secondary | ICD-10-CM

## 2016-05-28 DIAGNOSIS — Z3483 Encounter for supervision of other normal pregnancy, third trimester: Secondary | ICD-10-CM

## 2016-05-28 NOTE — Progress Notes (Signed)
   PRENATAL VISIT NOTE  Subjective:  Melissa Thomas is a 36 y.o. G2P1001 at 6920w1d being seen today for ongoing prenatal care.  She is currently monitored for the following issues for this low-risk pregnancy and has Supervision of normal pregnancy, antepartum on her problem list.  Patient reports some abdominal pruritis since yesterday.  Contractions: Irregular. Vag. Bleeding: None.  Movement: Present. Denies leaking of fluid.   The following portions of the patient's history were reviewed and updated as appropriate: allergies, current medications, past family history, past medical history, past social history, past surgical history and problem list. Problem list updated.  Objective:   Vitals:   05/28/16 1407  BP: (!) 142/88  Pulse: 98  Weight: 133 lb 6.4 oz (60.5 kg)  repeat BP 126/88  Fetal Status: Fetal Heart Rate (bpm): 150   Movement: Present     General:  Alert, oriented and cooperative. Patient is in no acute distress.  Skin: Skin is warm and dry. No rash noted.   Cardiovascular: Normal heart rate noted  Respiratory: Normal respiratory effort, no problems with respiration noted  Abdomen: Soft, gravid, appropriate for gestational age. Pain/Pressure: Present     Pelvic:  Cervical exam deferred        Extremities: Normal range of motion.  Edema: Trace  Mental Status: Normal mood and affect. Normal behavior. Normal judgment and thought content.   Assessment and Plan:  Pregnancy: G2P1001 at 4820w1d  1. Supervision of other normal pregnancy, antepartum Patient is doing well without complaints Bile acids ordered Plan for IOL at 41 weeks pending lab results - Bile acids, total  Term labor symptoms and general obstetric precautions including but not limited to vaginal bleeding, contractions, leaking of fluid and fetal movement were reviewed in detail with the patient. Please refer to After Visit Summary for other counseling recommendations.  No Follow-up on file.   Catalina AntiguaPeggy Aleera Gilcrease,  MD

## 2016-05-30 ENCOUNTER — Encounter: Payer: Self-pay | Admitting: Obstetrics and Gynecology

## 2016-05-30 ENCOUNTER — Inpatient Hospital Stay (HOSPITAL_COMMUNITY)
Admission: AD | Admit: 2016-05-30 | Discharge: 2016-06-02 | DRG: 775 | Disposition: A | Discharge status: Home / Self Care | Payer: BLUE CROSS/BLUE SHIELD | Source: Ambulatory Visit | Attending: Obstetrics & Gynecology | Admitting: Obstetrics & Gynecology

## 2016-05-30 ENCOUNTER — Telehealth: Payer: Self-pay | Admitting: *Deleted

## 2016-05-30 ENCOUNTER — Encounter (HOSPITAL_COMMUNITY): Payer: Self-pay | Admitting: *Deleted

## 2016-05-30 DIAGNOSIS — O26619 Liver and biliary tract disorders in pregnancy, unspecified trimester: Secondary | ICD-10-CM

## 2016-05-30 DIAGNOSIS — O26643 Intrahepatic cholestasis of pregnancy, third trimester: Secondary | ICD-10-CM

## 2016-05-30 DIAGNOSIS — K831 Obstruction of bile duct: Secondary | ICD-10-CM | POA: Diagnosis not present

## 2016-05-30 DIAGNOSIS — Z23 Encounter for immunization: Secondary | ICD-10-CM | POA: Diagnosis not present

## 2016-05-30 DIAGNOSIS — O2662 Liver and biliary tract disorders in childbirth: Secondary | ICD-10-CM | POA: Diagnosis not present

## 2016-05-30 DIAGNOSIS — O1404 Mild to moderate pre-eclampsia, complicating childbirth: Secondary | ICD-10-CM | POA: Diagnosis not present

## 2016-05-30 DIAGNOSIS — Z3A39 39 weeks gestation of pregnancy: Secondary | ICD-10-CM

## 2016-05-30 DIAGNOSIS — O1494 Unspecified pre-eclampsia, complicating childbirth: Secondary | ICD-10-CM | POA: Diagnosis not present

## 2016-05-30 DIAGNOSIS — O26649 Intrahepatic cholestasis of pregnancy, unspecified trimester: Secondary | ICD-10-CM | POA: Insufficient documentation

## 2016-05-30 DIAGNOSIS — O26613 Liver and biliary tract disorders in pregnancy, third trimester: Secondary | ICD-10-CM | POA: Diagnosis not present

## 2016-05-30 DIAGNOSIS — O1493 Unspecified pre-eclampsia, third trimester: Secondary | ICD-10-CM

## 2016-05-30 DIAGNOSIS — O1490 Unspecified pre-eclampsia, unspecified trimester: Secondary | ICD-10-CM | POA: Diagnosis not present

## 2016-05-30 DIAGNOSIS — Z412 Encounter for routine and ritual male circumcision: Secondary | ICD-10-CM | POA: Diagnosis not present

## 2016-05-30 LAB — COMPREHENSIVE METABOLIC PANEL
ALT: 16 U/L (ref 14–54)
AST: 27 U/L (ref 15–41)
Albumin: 3.1 g/dL — ABNORMAL LOW (ref 3.5–5.0)
Alkaline Phosphatase: 216 U/L — ABNORMAL HIGH (ref 38–126)
Anion gap: 8 (ref 5–15)
BILIRUBIN TOTAL: 0.6 mg/dL (ref 0.3–1.2)
BUN: 15 mg/dL (ref 6–20)
CALCIUM: 8.9 mg/dL (ref 8.9–10.3)
CHLORIDE: 104 mmol/L (ref 101–111)
CO2: 21 mmol/L — AB (ref 22–32)
Creatinine, Ser: 0.79 mg/dL (ref 0.44–1.00)
GLUCOSE: 98 mg/dL (ref 65–99)
Potassium: 3.8 mmol/L (ref 3.5–5.1)
Sodium: 133 mmol/L — ABNORMAL LOW (ref 135–145)
Total Protein: 7 g/dL (ref 6.5–8.1)

## 2016-05-30 LAB — CBC
HCT: 36.3 % (ref 36.0–46.0)
HEMOGLOBIN: 12 g/dL (ref 12.0–15.0)
MCH: 28.5 pg (ref 26.0–34.0)
MCHC: 33.1 g/dL (ref 30.0–36.0)
MCV: 86.2 fL (ref 78.0–100.0)
PLATELETS: 169 10*3/uL (ref 150–400)
RBC: 4.21 MIL/uL (ref 3.87–5.11)
RDW: 13.9 % (ref 11.5–15.5)
WBC: 7.4 10*3/uL (ref 4.0–10.5)

## 2016-05-30 LAB — BILE ACIDS, TOTAL: BILE ACIDS TOTAL: 15.1 umol/L (ref 4.7–24.5)

## 2016-05-30 LAB — PROTEIN / CREATININE RATIO, URINE
Creatinine, Urine: 79 mg/dL
PROTEIN CREATININE RATIO: 0.57 mg/mg{creat} — AB (ref 0.00–0.15)
TOTAL PROTEIN, URINE: 45 mg/dL

## 2016-05-30 LAB — WET PREP, GENITAL
Sperm: NONE SEEN
TRICH WET PREP: NONE SEEN

## 2016-05-30 LAB — TYPE AND SCREEN
ABO/RH(D): A POS
ANTIBODY SCREEN: NEGATIVE

## 2016-05-30 LAB — ABO/RH: ABO/RH(D): A POS

## 2016-05-30 MED ORDER — OXYTOCIN 40 UNITS IN LACTATED RINGERS INFUSION - SIMPLE MED
2.5000 [IU]/h | INTRAVENOUS | Status: DC
  Administered 2016-05-31: 2.5 [IU]/h via INTRAVENOUS

## 2016-05-30 MED ORDER — MISOPROSTOL 25 MCG QUARTER TABLET
25.0000 ug | ORAL_TABLET | ORAL | Status: DC | PRN
  Administered 2016-05-30 (×2): 25 ug via VAGINAL
  Filled 2016-05-30 (×3): qty 1

## 2016-05-30 MED ORDER — LACTATED RINGERS IV SOLN
INTRAVENOUS | Status: DC
  Administered 2016-05-31 (×3): via INTRAVENOUS

## 2016-05-30 MED ORDER — LACTATED RINGERS IV SOLN
500.0000 mL | INTRAVENOUS | Status: DC | PRN
  Administered 2016-05-31: 500 mL via INTRAVENOUS

## 2016-05-30 MED ORDER — HYDRALAZINE HCL 20 MG/ML IJ SOLN: 5.0000 mg | INTRAMUSCULAR | Status: DC | PRN

## 2016-05-30 MED ORDER — LIDOCAINE HCL (PF) 1 % IJ SOLN
30.0000 mL | INTRAMUSCULAR | Status: AC | PRN
  Administered 2016-05-31: 30 mL via SUBCUTANEOUS
  Filled 2016-05-30: qty 30

## 2016-05-30 MED ORDER — OXYTOCIN BOLUS FROM INFUSION
500.0000 mL | Freq: Once | INTRAVENOUS | Status: AC
  Administered 2016-05-31: 500 mL via INTRAVENOUS

## 2016-05-30 MED ORDER — FENTANYL CITRATE (PF) 100 MCG/2ML IJ SOLN
100.0000 ug | INTRAMUSCULAR | Status: DC | PRN
  Administered 2016-05-31 (×2): 100 ug via INTRAVENOUS
  Filled 2016-05-30 (×2): qty 2

## 2016-05-30 MED ORDER — TERBUTALINE SULFATE 1 MG/ML IJ SOLN
0.2500 mg | Freq: Once | INTRAMUSCULAR | Status: AC | PRN
  Administered 2016-05-31: 0.25 mg via SUBCUTANEOUS
  Filled 2016-05-30: qty 1

## 2016-05-30 MED ORDER — OXYCODONE-ACETAMINOPHEN 5-325 MG PO TABS: 2.0000 | ORAL_TABLET | ORAL | Status: DC | PRN

## 2016-05-30 MED ORDER — ACETAMINOPHEN 325 MG PO TABS: 650.0000 mg | ORAL_TABLET | ORAL | Status: DC | PRN

## 2016-05-30 MED ORDER — ONDANSETRON HCL 4 MG/2ML IJ SOLN: 4.0000 mg | Freq: Four times a day (QID) | INTRAMUSCULAR | Status: DC | PRN

## 2016-05-30 MED ORDER — SOD CITRATE-CITRIC ACID 500-334 MG/5ML PO SOLN: 30.0000 mL | ORAL | Status: DC | PRN

## 2016-05-30 MED ORDER — LABETALOL HCL 5 MG/ML IV SOLN: 20.0000 mg | INTRAVENOUS | Status: DC | PRN

## 2016-05-30 MED ORDER — OXYCODONE-ACETAMINOPHEN 5-325 MG PO TABS: 1.0000 | ORAL_TABLET | ORAL | Status: DC | PRN

## 2016-05-30 MED ORDER — ZOLPIDEM TARTRATE 5 MG PO TABS: 5.0000 mg | ORAL_TABLET | Freq: Every evening | ORAL | Status: DC | PRN

## 2016-05-30 NOTE — Telephone Encounter (Signed)
-----   Message from Catalina AntiguaPeggy Constant, MD sent at 05/30/2016  8:28 AM EDT ----- Please inform patient of diagnosis of cholestasis of pregnancy which is responsible for her itchy abdomen. This condition requires induction of labor. Her baby is full term and there are no reasons to keep her pregnant any longer. She needs to come to the hospital today!!!  L&D has been notified  Thanks  MariemouthPeggy

## 2016-05-30 NOTE — Progress Notes (Signed)
Labor Progress Note  Melissa Thomas is a 36 y.o. G2P1001 at 451w3d  admitted for induction of labor due to cholestasis.  S: Patient states she is feeling pain, but reports that is manageable. No complaints otherwise.   O:  BP (!) 145/98   Pulse 83   Temp 97.8 F (36.6 C) (Oral)   Resp 18   Ht 5\' 1"  (1.549 m)   Wt 60.3 kg (133 lb)   LMP 08/28/2015 (Approximate)   BMI 25.13 kg/m   No intake/output data recorded.  FHT:  FHR: 150 bpm, variability: moderate,  accelerations:  Present,  decelerations:  Absent UC:   regular, every 4 minutes SVE:   Dilation: 1 Effacement (%): 20 Station: -2 Exam by:: B Boyer RN  SROM/AROM: Intact  Pitocin @ 0 mu/min  Labs: Lab Results  Component Value Date   WBC 7.4 05/30/2016   HGB 12.0 05/30/2016   HCT 36.3 05/30/2016   MCV 86.2 05/30/2016   PLT 169 05/30/2016    Assessment / Plan: 36 y.o. G2P1001 6251w3d  Induction of labor due to cholestasis,  progressing well   Labor: Progressing normally Fetal Wellbeing:  Category I Pain Control:  Currently none, but has indicated Epidural when appropriate Anticipated MOD:  NSVD  Expectant management   Ivan AnchorsJohn Adeolu [facility] Medical Student 05/30/2016 10:51 PM   RESIDENT ADDENDUM  I have read the above note and agree with the medical student's findings and plan.  Tarri AbernethyAbigail J Lestine Rahe, MD, MPH PGY-2 [facility]

## 2016-05-30 NOTE — Progress Notes (Signed)
Labor Progress Note  Melissa Thomas is a 36 y.o. G2P1001 at 7330w3d  admitted for induction of labor due to choleostasis.  S: Patient is starting to feel a little bit of pain but is otherwise comfortable.  O:  BP (!) 152/91   Pulse 67   Resp 18   Ht 5\' 1"  (1.549 m)   Wt 60.3 kg (133 lb)   LMP 08/28/2015 (Approximate)   BMI 25.13 kg/m   No intake/output data recorded.  FHT:  FHR: 140 bpm, variability: moderate,  accelerations:  Present,  decelerations:  Absent UC:   irregular SVE:   Dilation: Closed Effacement (%): Thick Station: -2 Exam by:: lee SROM/AROM: Intact  Pitocin @ 0 mu/min  Labs: Lab Results  Component Value Date   WBC 7.4 05/30/2016   HGB 12.0 05/30/2016   HCT 36.3 05/30/2016   MCV 86.2 05/30/2016   PLT 169 05/30/2016    Assessment / Plan: 36 y.o. G2P1001 8230w3d Induction of labor due to cholestasis,  progressing well  Labor: Progressing normally Fetal Wellbeing:  Category I Pain Control:  Currently none, but has indicated Epidural when appropriate Anticipated MOD:  NSVD  Expectant management   Ivan AnchorsJohn Adeolu [facility] 05/30/2016 8:46 PM   RESIDENT ADDENDUM  I have read the above note and agree with the medical students findings and plan.  Tarri AbernethyAbigail J Lancaster, MD, MPH PGY-2 [facility]

## 2016-05-30 NOTE — Telephone Encounter (Signed)
Patient notified of results and to go to [facility] for check in.

## 2016-05-30 NOTE — H&P (Signed)
LABOR AND DELIVERY ADMISSION HISTORY AND PHYSICAL NOTE  Melissa Thomas is a 36 y.o. female G2P1001 with IUP at 1011w3d by LMP presenting for IOL for cholestasis of pregnancy diagnosed today. She reports +FM, + contractions, No LOF, no VB, no blurry vision, headaches or peripheral edema, and RUQ pain.  She plans on breast and bottle feeding.   Prenatal History/Complications:  Past Medical History: Past Medical History:  Diagnosis Date  . Medical history non-contributory     Past Surgical History: Past Surgical History:  Procedure Laterality Date  . NO PAST SURGERIES      Obstetrical History: OB History    Gravida Para Term Preterm AB Living   2 1 1     1    SAB TAB Ectopic Multiple Live Births           1      Social History: Social History   Social History  . Marital status: Married    Spouse name: N/A  . Number of children: N/A  . Years of education: N/A   Social History Main Topics  . Smoking status: Never Smoker  . Smokeless tobacco: Never Used  . Alcohol use 0.0 oz/week  . Drug use: No  . Sexual activity: Yes    Birth control/ protection: None   Other Topics Concern  . None   Social History Narrative  . None    Family History: Family History  Problem Relation Age of Onset  . Hypertension Father     Allergies: No Known Allergies  Prescriptions Prior to Admission  Medication Sig Dispense Refill Last Dose  . diphenhydrAMINE (BENADRYL) 2 % cream Apply 1 application topically 2 (two) times daily as needed for itching.   05/29/2016 at Unknown time  . Prenat-FeAsp-Meth-FA-DHA w/o A (PRENATE PIXIE) 10-0.6-0.4-200 MG CAPS Take 1 tablet by mouth daily. 30 capsule 12 05/29/2016     Review of Systems   All systems reviewed and negative except as stated in HPI  Physical Exam Blood pressure (!) 135/97, pulse 74, height 5\' 1"  (1.549 m), weight 133 lb (60.3 kg), last menstrual period 08/28/2015. General appearance: alert, cooperative and no distress Lungs: clear  to auscultation bilaterally Heart: regular rate and rhythm Abdomen: soft, non-tender; bowel sounds normal Extremities: No calf swelling or tenderness Presentation: cephalic Fetal monitoring: 130 bpm, moderate variability, accels, no decels Uterine activity: occasional contractions Dilation: Closed Effacement (%): Thick Station: -2 Exam by:: lee   Prenatal labs: ABO, Rh: A/Positive/-- (01/22 1653) Antibody: Negative (01/22 1653) Rubella: Immune RPR: Non Reactive (03/07 0934)  HBsAg: Negative (01/22 1653)  HIV: Non Reactive (03/07 0934)  GBS: Negative (05/01 1631)  2 hr Glucola: normal Genetic screening:  normal Anatomy US: normal  Prenatal Transfer Tool  Maternal Diabetes: No Genetic Screening: Normal Maternal Ultrasounds/Referrals: Normal Fetal Ultrasounds or other Referrals:  Fetal echo Maternal Substance Abuse:  No Significant Maternal Medications:  None Significant Maternal Lab Results: Lab values include: Group B Strep negative  Results for orders placed or performed during the hospital encounter of 05/30/16 (from the past 24 hour(s))  CBC   Collection Time: 05/30/16  4:20 PM  Result Value Ref Range   WBC 7.4 4.0 - 10.5 K/uL   RBC 4.21 3.87 - 5.11 MIL/uL   Hemoglobin 12.0 12.0 - 15.0 g/dL   HCT 16.136.3 09.636.0 - 04.546.0 %   MCV 86.2 78.0 - 100.0 fL   MCH 28.5 26.0 - 34.0 pg   MCHC 33.1 30.0 - 36.0 g/dL   RDW 13.9  11.5 - 15.5 %   Platelets 169 150 - 400 K/uL    Patient Active Problem List   Diagnosis Date Noted  . Cholestasis of pregnancy 05/30/2016  . Cholestasis of pregnancy in third trimester 05/30/2016  . Supervision of normal pregnancy, antepartum 01/30/2016    Assessment: Melissa Thomas is a 36 y.o. G2P1001 at [redacted]w[redacted]d here for IOL for cholestasis.  #Labor: start cytotec #Pain: controleld with IV meds #FWB: Category 1 #ID:  GBS neg #MOF: breast #MOC: Depo #Circ:  Yes  Howard Pouch, MD PGY-1 [facility]  05/30/2016, 5:44 PM   CNM  attestation:  I have seen and examined this patient; I agree with above documentation in the resident's note.   Melissa Thomas is a 36 y.o. G2P1001 here for IOL for cholestasis, now with new dx of gHTN; denies s/s pre-e  PE: BP 137/71   Pulse 78   Temp 97.9 F (36.6 C) (Oral)   Resp 18   Ht 5\' 1"  (1.549 m)   Wt 60.3 kg (133 lb)   LMP 08/28/2015 (Approximate)   SpO2 100%   BMI 25.13 kg/m  Gen: calm comfortable, NAD Resp: normal effort, no distress Abd: gravid  ROS, labs, PMH reviewed  Urine P/C: 0.57, labs nl Wet prep: +clue, +yeast (sent due to yellowish d/c; neg GC/chlam x 3wks ago)  Plan: Admit to [facility] cytotec for cx ripening Watch BPs - none in severe range yet Anticipate SVD  Thomas, Melissa CNM 05/31/2016, 8:35 AM

## 2016-05-31 ENCOUNTER — Inpatient Hospital Stay (HOSPITAL_COMMUNITY): Payer: BLUE CROSS/BLUE SHIELD | Admitting: Anesthesiology

## 2016-05-31 ENCOUNTER — Encounter (HOSPITAL_COMMUNITY): Payer: Self-pay | Admitting: *Deleted

## 2016-05-31 DIAGNOSIS — Z3A39 39 weeks gestation of pregnancy: Secondary | ICD-10-CM

## 2016-05-31 DIAGNOSIS — K831 Obstruction of bile duct: Secondary | ICD-10-CM

## 2016-05-31 DIAGNOSIS — O1490 Unspecified pre-eclampsia, unspecified trimester: Secondary | ICD-10-CM | POA: Diagnosis not present

## 2016-05-31 DIAGNOSIS — O2662 Liver and biliary tract disorders in childbirth: Secondary | ICD-10-CM

## 2016-05-31 LAB — RPR: RPR Ser Ql: NONREACTIVE

## 2016-05-31 MED ORDER — WITCH HAZEL-GLYCERIN EX PADS: 1.0000 "application " | MEDICATED_PAD | CUTANEOUS | Status: DC | PRN

## 2016-05-31 MED ORDER — SIMETHICONE 80 MG PO CHEW
80.0000 mg | CHEWABLE_TABLET | ORAL | Status: DC | PRN
Start: 2016-05-31 — End: 2016-06-02

## 2016-05-31 MED ORDER — IBUPROFEN 600 MG PO TABS
600.0000 mg | ORAL_TABLET | Freq: Four times a day (QID) | ORAL | Status: DC
  Administered 2016-05-31 – 2016-06-02 (×8): 600 mg via ORAL
  Filled 2016-05-31 (×8): qty 1

## 2016-05-31 MED ORDER — FENTANYL 2.5 MCG/ML BUPIVACAINE 1/10 % EPIDURAL INFUSION (WH - ANES)
14.0000 mL/h | INTRAMUSCULAR | Status: DC | PRN
  Administered 2016-05-31: 14 mL/h via EPIDURAL
  Administered 2016-05-31: 12 mL/h via EPIDURAL
  Filled 2016-05-31: qty 100

## 2016-05-31 MED ORDER — FENTANYL CITRATE (PF) 100 MCG/2ML IJ SOLN
INTRAMUSCULAR | Status: DC | PRN
  Administered 2016-05-31: 100 ug via EPIDURAL

## 2016-05-31 MED ORDER — LACTATED RINGERS IV SOLN
500.0000 mL | Freq: Once | INTRAVENOUS | Status: AC
  Administered 2016-05-31: 500 mL via INTRAVENOUS

## 2016-05-31 MED ORDER — LIDOCAINE HCL (PF) 1 % IJ SOLN
INTRAMUSCULAR | Status: DC | PRN
  Administered 2016-05-31: 5 mL
  Administered 2016-05-31: 5 mL via EPIDURAL

## 2016-05-31 MED ORDER — EPHEDRINE 5 MG/ML INJ
10.0000 mg | INTRAVENOUS | Status: DC | PRN
  Filled 2016-05-31: qty 2

## 2016-05-31 MED ORDER — DIPHENHYDRAMINE HCL 25 MG PO CAPS: 25.0000 mg | ORAL_CAPSULE | Freq: Four times a day (QID) | ORAL | Status: DC | PRN

## 2016-05-31 MED ORDER — TETANUS-DIPHTH-ACELL PERTUSSIS 5-2.5-18.5 LF-MCG/0.5 IM SUSP: 0.5000 mL | Freq: Once | INTRAMUSCULAR | Status: DC

## 2016-05-31 MED ORDER — DIPHENHYDRAMINE HCL 50 MG/ML IJ SOLN: 12.5000 mg | INTRAMUSCULAR | Status: DC | PRN

## 2016-05-31 MED ORDER — FENTANYL CITRATE (PF) 100 MCG/2ML IJ SOLN
INTRAMUSCULAR | Status: AC
  Filled 2016-05-31: qty 2

## 2016-05-31 MED ORDER — PHENYLEPHRINE 40 MCG/ML (10ML) SYRINGE FOR IV PUSH (FOR BLOOD PRESSURE SUPPORT): 80.0000 ug | PREFILLED_SYRINGE | INTRAVENOUS | Status: DC | PRN

## 2016-05-31 MED ORDER — PHENYLEPHRINE 40 MCG/ML (10ML) SYRINGE FOR IV PUSH (FOR BLOOD PRESSURE SUPPORT)
80.0000 ug | PREFILLED_SYRINGE | INTRAVENOUS | Status: DC | PRN
  Filled 2016-05-31: qty 5
  Filled 2016-05-31: qty 10

## 2016-05-31 MED ORDER — ONDANSETRON HCL 4 MG PO TABS: 4.0000 mg | ORAL_TABLET | ORAL | Status: DC | PRN

## 2016-05-31 MED ORDER — ZOLPIDEM TARTRATE 5 MG PO TABS
5.0000 mg | ORAL_TABLET | Freq: Every evening | ORAL | Status: DC | PRN
Start: 2016-05-31 — End: 2016-06-02

## 2016-05-31 MED ORDER — OXYTOCIN 40 UNITS IN LACTATED RINGERS INFUSION - SIMPLE MED
1.0000 m[IU]/min | INTRAVENOUS | Status: DC
  Filled 2016-05-31: qty 1000

## 2016-05-31 MED ORDER — PHENYLEPHRINE 40 MCG/ML (10ML) SYRINGE FOR IV PUSH (FOR BLOOD PRESSURE SUPPORT)
80.0000 ug | PREFILLED_SYRINGE | INTRAVENOUS | Status: DC | PRN
  Filled 2016-05-31: qty 5

## 2016-05-31 MED ORDER — BENZOCAINE-MENTHOL 20-0.5 % EX AERO
1.0000 "application " | INHALATION_SPRAY | CUTANEOUS | Status: DC | PRN
  Administered 2016-05-31: 1 via TOPICAL
  Filled 2016-05-31: qty 56

## 2016-05-31 MED ORDER — ONDANSETRON HCL 4 MG/2ML IJ SOLN
4.0000 mg | INTRAMUSCULAR | Status: DC | PRN
Start: 2016-05-31 — End: 2016-06-02

## 2016-05-31 MED ORDER — LACTATED RINGERS IV SOLN: 500.0000 mL | Freq: Once | INTRAVENOUS | Status: DC

## 2016-05-31 MED ORDER — EPHEDRINE 5 MG/ML INJ: 10.0000 mg | INTRAVENOUS | Status: DC | PRN

## 2016-05-31 MED ORDER — PRENATAL MULTIVITAMIN CH
1.0000 | ORAL_TABLET | Freq: Every day | ORAL | Status: DC
  Administered 2016-05-31 – 2016-06-01 (×2): 1 via ORAL
  Filled 2016-05-31 (×2): qty 1

## 2016-05-31 MED ORDER — SENNOSIDES-DOCUSATE SODIUM 8.6-50 MG PO TABS
2.0000 | ORAL_TABLET | ORAL | Status: DC
  Administered 2016-05-31 – 2016-06-01 (×2): 2 via ORAL
  Filled 2016-05-31 (×2): qty 2

## 2016-05-31 MED ORDER — ACETAMINOPHEN 325 MG PO TABS
650.0000 mg | ORAL_TABLET | ORAL | Status: DC | PRN
  Administered 2016-06-01 – 2016-06-02 (×2): 650 mg via ORAL
  Filled 2016-05-31 (×2): qty 2

## 2016-05-31 MED ORDER — DIBUCAINE 1 % RE OINT: 1.0000 "application " | TOPICAL_OINTMENT | RECTAL | Status: DC | PRN

## 2016-05-31 MED ORDER — COCONUT OIL OIL
1.0000 "application " | TOPICAL_OIL | Status: DC | PRN
  Filled 2016-05-31: qty 120

## 2016-05-31 MED ORDER — EPHEDRINE 5 MG/ML INJ
10.0000 mg | INTRAVENOUS | Status: DC | PRN
Start: 2016-05-31 — End: 2016-05-31
  Filled 2016-05-31: qty 2

## 2016-05-31 NOTE — Lactation Note (Signed)
This note was copied from a baby's chart. Lactation Consultation Note  Patient Name: Melissa Thomas'QToday's Date: 05/31/2016 Reason for consult: Initial assessment Baby at 4 hr of life. Mom did not bf her older child. Upon entry baby was sleeping in mom's arms. Mom reports baby has not eaten in life time. She stated she was sucking his finger and she offered the breast but he went to sleep instead of eating. Mom has small firm breast with everted nipple. Demonstrated manual expression, no colostrum noted bilaterally. Baby had a small emesis while attempting to feed. Discussed baby behavior, feeding frequency, baby belly size, voids, wt loss, breast changes, and nipple care. Given lactation handouts. Aware of OP services and support group. Mom will leave the baby sts, offer the breast on demand 8+/24hr, and continue trying to express milk to spoon feed. If she decides to offer formula, she will use hospital volume guidelines and start pumping with DEBP 8-12x/24hr.      Maternal Data Has patient been taught Hand Expression?: Yes Does the patient have breastfeeding experience prior to this delivery?: No  Feeding Feeding Type: Breast Fed Length of feed: 0 min  LATCH Score/Interventions Latch: Too sleepy or reluctant, no latch achieved, no sucking elicited. Intervention(s): Waking techniques;Teach feeding cues  Audible Swallowing: None Intervention(s): Skin to skin;Hand expression  Type of Nipple: Everted at rest and after stimulation  Comfort (Breast/Nipple): Soft / non-tender     Hold (Positioning): Full assist, staff holds infant at breast Intervention(s): Position options;Support Pillows  LATCH Score: 4  Lactation Tools Discussed/Used WIC Program: Yes   Consult Status Consult Status: Follow-up Date: 06/01/16 Follow-up type: In-patient    [name] 05/31/2016, 3:56 PM

## 2016-05-31 NOTE — Progress Notes (Addendum)
L&D RN reported during transfer of patient that wet prep was done last evening due to vaginal discharge that resulted with yeast and bacterial vaginosis. RN reported that plan was to treat post partum. No orders are in place for treatment. Called Howard PouchLauren Feng, who stated that she would look into this and place orders as needed. Maudry Diegosborne, Makaelyn Aponte OaklandHudspeth  Update: Dr. Mosetta PuttFeng called RN back to notify that no treatment is needed now post partum. Earl Galasborne, Linda HedgesStefanie H. Rivera ColenHudspeth

## 2016-05-31 NOTE — Anesthesia Postprocedure Evaluation (Signed)
Anesthesia Post Note  Patient: Melissa Thomas  Procedure(s) Performed: * No procedures listed *  Patient location during evaluation: Mother Baby Anesthesia Type: Epidural Level of consciousness: awake, awake and alert, oriented and patient cooperative Pain management: pain level controlled Vital Signs Assessment: post-procedure vital signs reviewed and stable Respiratory status: spontaneous breathing, nonlabored ventilation and respiratory function stable Cardiovascular status: stable Postop Assessment: no headache, no backache, no signs of nausea or vomiting and patient able to bend at knees Anesthetic complications: no        Last Vitals:  Vitals:   05/31/16 1400 05/31/16 1757  BP: 126/80 118/79  Pulse: 97 (!) 107  Resp: 18 18  Temp: 37.1 C 37.4 C    Last Pain:  Vitals:   05/31/16 1845  TempSrc:   PainSc: 0-No pain   Pain Goal: Patients Stated Pain Goal: 2 (05/31/16 1520)               Elston Aldape L

## 2016-05-31 NOTE — Progress Notes (Signed)
Patient ID: Melissa Thomas, female   DOB: [birth year], 36 y.o.   MRN: [id number]  Just getting epidural- not comfortable yet Last few BPs higher than prev: 153/102, 139/94, 156/97 FHR 130s, +accels, no decels Ctx q 4 mins, spont Cx deferred at present  IUP@term  Cholestasis gHTN Latent labor  After she is comfortable, RN to examine cx and start Pit prn Will discuss at rounds need for Mag w/ elevated BPs (the highest are slightly lower than severe range)  Melissa Thomas, Torrion Witter CNM 05/31/2016 7:52 AM

## 2016-05-31 NOTE — Anesthesia Procedure Notes (Signed)
Epidural Patient location during procedure: OB Start time: 05/31/2016 6:50 AM End time: 05/31/2016 7:10 AM  Staffing Anesthesiologist: Jairo BenJACKSON, Maryam Feely Performed: anesthesiologist   Preanesthetic Checklist Completed: patient identified, surgical consent, pre-op evaluation, timeout performed, IV checked, risks and benefits discussed and monitors and equipment checked  Epidural Patient position: sitting Prep: site prepped and draped and DuraPrep Patient monitoring: blood pressure, continuous pulse ox and heart rate Approach: midline Location: L3-L4 Injection technique: LOR air  Needle:  Needle type: Tuohy  Needle gauge: 17 G Needle length: 9 cm Needle insertion depth: 5 cm Catheter type: closed end flexible Catheter size: 19 Gauge Catheter at skin depth: 10 cm Test dose: negative (1% lidocaine)  Assessment Events: blood not aspirated, injection not painful, no injection resistance, negative IV test and no paresthesia  Additional Notes Pt identified in Labor room.  Monitors applied. Working IV access confirmed. Sterile prep, drape lumbar spine.  1% lido local L 3,4.  #17ga Touhy LOR air at 5 cm L 3,4, cath in easily to 10 cm skin. Test dose OK, cath dosed and infusion begun.  Patient asymptomatic, VSS, no heme aspirated, tolerated well.  Sandford Craze Mutasim Tuckey, MDReason for block:procedure for pain

## 2016-05-31 NOTE — Plan of Care (Signed)
Problem: Safety: Goal: Ability to remain free from injury will improve Outcome: Completed/Met Date Met: 05/31/16 Encouraged patient to call for staff assistance for the first time ambulating.  Problem: Education: Goal: Knowledge of condition will improve Admission education, safety and unit protocols reviewed with patient and significant other. Patient verbalizes understanding.   Problem: Nutritional: Goal: Mothers verbalization of comfort with breastfeeding process will improve Outcome: Completed/Met Date Met: 05/31/16 Patient trying to decide on breast and bottle feeding. Discussed benefits of breast feeding and risks of giving a bottle if breastfeeding.

## 2016-05-31 NOTE — Anesthesia Pain Management Evaluation Note (Signed)
  CRNA Pain Management Visit Note  Patient: Melissa Thomas, 36 y.o., female  "Hello [facility]. We have an anesthesia team available at all times to provide care throughout the hospital, including epidural management and anesthesia for C-section. I don't know your plan for the delivery whether it a natural birth, water birth, IV sedation, nitrous supplementation, doula or epidural, but we want to meet your pain goals."   1.Was your pain managed to your expectations on prior hospitalizations?   Yes   2.What is your expectation for pain management during this hospitalization?     Epidural  3.How can we help you reach that goal? epidural  Record the patient's initial score and the patient's pain goal.   Pain: 0  Pain Goal: 4 [facility] wants you to be able to say your pain was always managed very well.  Madison HickmanGREGORY,Melissa Thomas 05/31/2016

## 2016-05-31 NOTE — Progress Notes (Signed)
S: Patient seen & examined for progress of labor. Patient comfortable with epidural. No N/V, no current complaints.    O:  Vitals:   05/31/16 0751 05/31/16 0801 05/31/16 0816 05/31/16 0830  BP: (!) 152/102 (!) 140/92 137/71 125/75  Pulse: 80 93 78 76  Resp:      Temp:      TempSrc:      SpO2:      Weight:      Height:        Dilation: 6 Effacement (%): 90 Cervical Position: Middle Station: 0 Presentation: Vertex Exam by:: Lorn Junes. Goodman, RN   FHT: 130 bpm, mod var, +accels, +early decels TOCO: irregular q2-934min   A/P: Labor: progressing normally Fetal wellbeing Category 1 Pain control: well-controlled with epidural  Continue expectant management Anticipate SVD

## 2016-05-31 NOTE — Progress Notes (Signed)
Labor Progress Note  Melissa Thomas is a 36 y.o. G2P1001 at 145w4d  admitted for induction of labor due to cholestasis.  S: Patient reports that she still feels some pain, but IV pain medicine has given her some relief. Mentions that it is tough to sleep as baby seems to be more active.   O:  BP (!) 144/101   Pulse 69   Temp 98.1 F (36.7 C) (Oral)   Resp 18   Ht 5\' 1"  (1.549 m)   Wt 133 lb (60.3 kg)   LMP 08/28/2015 (Approximate)   BMI 25.13 kg/m   No intake/output data recorded.  FHT:  FHR: 150 bpm, variability: moderate,  accelerations:  Present,  decelerations:  Absent UC:   regular, every 3 minutes SVE:   Dilation: 3 Effacement (%): 50, 40 Station: -2 Exam by:: OGE EnergyBboyer, RN SROM/AROM: Intact  Pitocin @ 0 mu/min  Labs: Lab Results  Component Value Date   WBC 7.4 05/30/2016   HGB 12.0 05/30/2016   HCT 36.3 05/30/2016   MCV 86.2 05/30/2016   PLT 169 05/30/2016    Assessment / Plan: 36 y.o. G2P1001 715w4d    Induction of labor due to cholestasis,  progressing well on pitocin  Labor: Progressing normally Fetal Wellbeing:  Category I Pain Control:  IV pain meds and will start epidural at patient's request Anticipated MOD:  NSVD  Expectant management   Ivan AnchorsJohn Adeolu [facility] Medical [facility] 05/31/2016 2:48 AM   RESIDENT ADDENDUM  I have read the above note and agree with the medical students findings and plan.  Tarri AbernethyAbigail J Adi Seales, MD, MPH PGY-2 [facility] Pager [phone]

## 2016-05-31 NOTE — Anesthesia Preprocedure Evaluation (Addendum)
Anesthesia Evaluation  Patient identified by MRN, date of birth, ID band Patient awake    Reviewed: Allergy & Precautions, NPO status , Patient's Chart, lab work & pertinent test results  History of Anesthesia Complications Negative for: history of anesthetic complications  Airway Mallampati: II  TM Distance: >3 FB Neck ROM: Full    Dental  (+) Dental Advisory Given   Pulmonary neg pulmonary ROS,    breath sounds clear to auscultation       Cardiovascular negative cardio ROS   Rhythm:Regular Rate:Normal     Neuro/Psych negative neurological ROS     GI/Hepatic negative GI ROS, Neg liver ROS,   Endo/Other  negative endocrine ROS  Renal/GU negative Renal ROS     Musculoskeletal   Abdominal   Peds  Hematology plt 169k   Anesthesia Other Findings   Reproductive/Obstetrics (+) Pregnancy                            Anesthesia Physical Anesthesia Plan  ASA: II  Anesthesia Plan: Epidural   Post-op Pain Management:    Induction:   Airway Management Planned: Natural Airway  Additional Equipment:   Intra-op Plan:   Post-operative Plan:   Informed Consent: I have reviewed the patients History and Physical, chart, labs and discussed the procedure including the risks, benefits and alternatives for the proposed anesthesia with the patient or authorized representative who has indicated his/her understanding and acceptance.   Dental advisory given  Plan Discussed with:   Anesthesia Plan Comments: (Patient identified. Risks/Benefits/Options discussed with patient including but not limited to bleeding, infection, nerve damage, paralysis, failed block, incomplete pain control, headache, blood pressure changes, nausea, vomiting, reactions to medication both or allergic, itching and postpartum back pain. Confirmed with bedside nurse the patient's most recent platelet count. Confirmed with patient  that they are not currently taking any anticoagulation, have any bleeding history or any family history of bleeding disorders. Patient expressed understanding and wished to proceed. All questions were answered. )        Anesthesia Quick Evaluation

## 2016-06-01 ENCOUNTER — Encounter (HOSPITAL_COMMUNITY): Payer: Self-pay | Admitting: *Deleted

## 2016-06-01 MED ORDER — SUCROSE 24% NICU/PEDS ORAL SOLUTION
0.5000 mL | OROMUCOSAL | Status: DC | PRN
  Filled 2016-06-01: qty 0.5

## 2016-06-01 MED ORDER — ACETAMINOPHEN FOR CIRCUMCISION 160 MG/5 ML
40.0000 mg | Freq: Once | ORAL | Status: DC
Start: 2016-06-01 — End: 2016-06-01
  Filled 2016-06-01: qty 1.25

## 2016-06-01 MED ORDER — ACETAMINOPHEN FOR CIRCUMCISION 160 MG/5 ML
40.0000 mg | ORAL | Status: DC | PRN
  Filled 2016-06-01: qty 1.25

## 2016-06-01 MED ORDER — EPINEPHRINE TOPICAL FOR CIRCUMCISION 0.1 MG/ML
1.0000 [drp] | TOPICAL | Status: DC | PRN
  Filled 2016-06-01: qty 0.05

## 2016-06-01 MED ORDER — LIDOCAINE 1% INJECTION FOR CIRCUMCISION
0.8000 mL | INJECTION | Freq: Once | INTRAVENOUS | Status: DC
  Filled 2016-06-01: qty 1

## 2016-06-01 NOTE — Progress Notes (Signed)
POSTPARTUM PROGRESS NOTE  Post Partum Day #1  Subjective:  Melissa Thomas is a 36 y.o. [id number] 6323w4d s/p SVD.  No acute events overnight.  Pt denies problems with ambulating, voiding or po intake.  She denies nausea or vomiting.  Pain is well controlled.  She has had flatus. She has had bowel movement.  Lochia Small.   Objective: Blood pressure 124/78, pulse 80, temperature 98.2 F (36.8 C), temperature source Oral, resp. rate 18, height 5\' 1"  (1.549 m), weight 133 lb (60.3 kg), last menstrual period 08/28/2015, SpO2 98 %, unknown if currently breastfeeding.  Physical Exam:  General: alert, cooperative and no distress Lochia:normal flow Chest: CTAB Heart: RRR no m/r/g Abdomen: +BS, soft, nontender Uterine Fundus: firm, below umbilicus DVT Evaluation: No calf swelling or tenderness Extremities: no LE edema   Recent Labs  05/30/16 1620  HGB 12.0  HCT 36.3    Assessment/Plan:  ASSESSMENT: Melissa Thomas is a 36 y.o. [id number] 3723w4d s/p SVD, doing well. Patient with mild pre-eclampsia, now with BP controlled within normal limits. She is having trouble getting baby to latch. - lactation consult - baby for circ today - patient would like to plan on staying in hospital another night - plan on DC tomorrow - planning depo for contraception   LOS: 2 days   Howard PouchLauren Feng, MD PGY-1 [facility]  06/01/2016, 7:52 AM   CNM attestation Post Partum Day #1 I have seen and examined this patient and agree with above documentation in the resident's note.   Melissa Thomas is a 36 y.o. [id number] s/p SVD.  Pt denies problems with ambulating, voiding or po intake. Pain is well controlled.  Plan for birth control is Depo-Provera.  Method of Feeding: breast  PE:  BP 124/78 (BP Location: Left Arm)   Pulse 80   Temp 98.2 F (36.8 C) (Oral)   Resp 18   Ht 5\' 1"  (1.549 m)   Wt 60.3 kg (133 lb)   LMP 08/28/2015 (Approximate)   SpO2 98%   Breastfeeding? Unknown   BMI 25.13 kg/m  Fundus  firm  Plan for discharge: 06/02/16 BPs all wnl since delivery- will continue to watch  Cam HaiSHAW, KIMBERLY, CNM 9:41 AM 06/01/2016

## 2016-06-02 MED ORDER — IBUPROFEN 600 MG PO TABS: 600.0000 mg | ORAL_TABLET | Freq: Four times a day (QID) | ORAL | 0 refills | Status: DC | PRN

## 2016-06-02 NOTE — Lactation Note (Addendum)
This note was copied from a baby's chart. Lactation Consultation Note  Patient Name: Melissa Thomas NFAOZ'HToday's Date: 06/02/2016 Reason for consult: Follow-up assessment;Infant weight loss (8% weight loss, LC encouraged mom to call for feeding assessment after she finishes breakfast ) 1st LC visit for the day. Per mom feeling very tired due to the baby cluster feeding all night.  Mom denies sore nipples , and mentioned they were sore originally but have improved.  Sore nipple and engorgement prevention and tx reviewed.  Mom plans to rent a DEBP for 2 weeks.  Mom requested to finish breakfast  Then feed. LC will return for feeding assessment , [phone]    Maternal Data    Feeding    LATCH Score/Interventions                Intervention(s): Breastfeeding basics reviewed     Lactation Tools Discussed/Used Pump Review: Setup, frequency, and cleaning;Milk Storage   Consult Status Consult Status: Follow-up Date: 06/02/16 Follow-up type: In-patient    Melissa Thomas 06/02/2016, 9:17 AM

## 2016-06-02 NOTE — Discharge Summary (Signed)
OB Discharge Summary     Patient Name: Melissa Thomas DOB: 1980-11-04 MRN: [id number]  Date of admission: 05/30/2016 Delivering MD: Ernestina Penna MICHAEL   Date of discharge: 06/02/2016  Admitting diagnosis: [redacted]w[redacted]d, Induction  Intrauterine pregnancy: [redacted]w[redacted]d     Secondary diagnosis:  Active Problems:   Cholestasis of pregnancy in third trimester   Pre-eclampsia  Additional problems: none     Discharge diagnosis: Term Pregnancy Delivered and Preeclampsia (mild)                                                                                                Post partum procedures:none  Augmentation: Cytotec  Complications: None  Hospital course:  Induction of Labor With Vaginal Delivery   36 y.o. yo [id number] at [redacted]w[redacted]d was admitted to the hospital 05/30/2016 for induction of labor.  Indication for induction: Cholestasis of pregnancy. Upon admission she was having elevated BPs with a P/C ratio of 0.57 and so a dx of mild pre-e was noted. She was asymptomatic and did not require magnesium.  Patient had an uncomplicated labor course as follows: Membrane Rupture Time/Date: 5:12 AM ,05/31/2016   Intrapartum Procedures: Episiotomy: None [1]                                         Lacerations:  2nd degree [3];Perineal [11]  Patient had delivery of a Viable infant.  Information for the patient's newborn:  Magaby, Rumberger [[id number]]  Delivery Method: Vag-Spont   05/31/2016  Details of delivery can be found in separate delivery note.  Patient had a routine postpartum course. Patient is discharged home 06/02/16.  Physical exam  Vitals:   06/01/16 0123 06/01/16 0513 06/01/16 1724 06/02/16 0620  BP: 114/72 124/78 136/89 108/72  Pulse: 88 80 75 89  Resp: 18 18 18 14   Temp: 98.4 F (36.9 C) 98.2 F (36.8 C) 98.2 F (36.8 C) 98.7 F (37.1 C)  TempSrc: Oral Oral  Oral  SpO2:    99%  Weight:      Height:       General: alert and cooperative Lochia: appropriate Uterine Fundus:  firm Incision: N/A DVT Evaluation: No evidence of DVT seen on physical exam. Labs: Lab Results  Component Value Date   WBC 7.4 05/30/2016   HGB 12.0 05/30/2016   HCT 36.3 05/30/2016   MCV 86.2 05/30/2016   PLT 169 05/30/2016   CMP Latest Ref Rng & Units 05/30/2016  Glucose 65 - 99 mg/dL 98  BUN 6 - 20 mg/dL 15  Creatinine 1.30 - 8.65 mg/dL 7.84  Sodium 696 - 295 mmol/L 133(L)  Potassium 3.5 - 5.1 mmol/L 3.8  Chloride 101 - 111 mmol/L 104  CO2 22 - 32 mmol/L 21(L)  Calcium 8.9 - 10.3 mg/dL 8.9  Total Protein 6.5 - 8.1 g/dL 7.0  Total Bilirubin 0.3 - 1.2 mg/dL 0.6  Alkaline Phos 38 - 126 U/L 216(H)  AST 15 - 41 U/L 27  ALT 14 - 54 U/L 16  Discharge instruction: per After Visit Summary and "Baby and Me Booklet".  After visit meds:  Allergies as of 06/02/2016   No Known Allergies     Medication List    TAKE these medications   diphenhydrAMINE 2 % cream Commonly known as:  BENADRYL Apply 1 application topically 2 (two) times daily as needed for itching.   ibuprofen 600 MG tablet Commonly known as:  ADVIL,MOTRIN Take 1 tablet (600 mg total) by mouth every 6 (six) hours as needed.   PRENATE PIXIE 10-0.6-0.4-200 MG Caps Take 1 tablet by mouth daily.       Diet: routine diet  Activity: Advance as tolerated. Pelvic rest for 6 weeks.   Outpatient follow up:[facility] for BP check in 1 week, then 4 wk PP visit Follow up Appt:No future appointments. Follow up Visit:No Follow-up on file.  Postpartum contraception: Depo Provera  Newborn Data: Live born female  Birth Weight: 7 lb 13.8 oz (3566 g) APGAR: 8, 9  Baby Feeding: Bottle and Breast Disposition:home with mother   06/02/2016 Cam HaiSHAW, Shanora Christensen, CNM  9:07 AM

## 2016-06-02 NOTE — Lactation Note (Signed)
This note was copied from a baby's chart. Lactation Consultation Note  Patient Name: Melissa Thomas Areta Haberhli Walrath ZOXWR'UToday's Date: 06/02/2016 Reason for consult: Follow-up assessment;Infant weight loss (8% weight loss / supplement per Dr, Chestine Sporelark , per RN )  [facility] , mom did not call LC, LC went to recheck on mom and baby for feeding. LC changed a large wet diaper.  LC placed baby STS in cross cradle position - assisted with positioning and depth.  LC discussed with parents SNS at the breast for more stimulation and both receptive.  LC showed dad how he could gently insert the 56F SNS in the corner of the baby's mouth when the  Baby is already latched. Baby found the tube and lost the depth.  LC briefly unlatched the baby and showed how to apply SNS slightly over the nipple  So the baby could obtain a deep latch with SNS. Baby fed for 20 mins and took 24 ml of formula via the SNS.  Per mom comfortable , nipple well rounded when the baby released and baby satisfied. LC recommended for LC plan at home , also due to mom being so tired to supplement at the breast.  LC explored options with mom and dad.  LC discussed if the they use  the SNS at the breast it will enhance the milk coming in until the weight comes  Up. The other option is to feed 1st at the breast and supplement afterwards with a bottle.  Also to add post  pumping both breast for 10 -15 mins to enhance the milk coming in and supplement back to baby. Post pump after 5-6 feedings until the milk comes in.  Mom rented a DEBP and LC instructed both mom and dad.  Sore nipple and engorgement prevention reviewed.  LC stressed the importance of STS feedings until the baby can stay  awake for feeding.      Maternal Data Has patient been taught Hand Expression?: Yes  Feeding Feeding Type: Breast Fed Length of feed: 20 min  LATCH Score/Interventions Latch: Grasps breast easily, tongue down, lips flanged, rhythmical  sucking. Intervention(s): Skin to skin;Waking techniques;Teach feeding cues  Audible Swallowing: Spontaneous and intermittent  Type of Nipple: Everted at rest and after stimulation  Comfort (Breast/Nipple): Soft / non-tender     Hold (Positioning): Assistance needed to correctly position infant at breast and maintain latch. Intervention(s): Breastfeeding basics reviewed;Support Pillows;Position options;Skin to skin  LATCH Score: 9  Lactation Tools Discussed/Used Tools: Pump Breast pump type: Double-Electric Breast Pump Pump Review: Setup, frequency, and cleaning;Milk Storage   Consult Status Consult Status: Complete Date: 06/02/16 Follow-up type: In-patient    Matilde SprangMargaret Ann Leatta Alewine 06/02/2016, 10:37 AM

## 2016-06-02 NOTE — Plan of Care (Signed)
Problem: Education: Goal: Knowledge of condition will improve Discharge education reviewed with patient and significant other. Patient verbalizes understanding of information.    

## 2016-06-02 NOTE — Discharge Instructions (Signed)

## 2016-06-02 NOTE — Progress Notes (Signed)
Post Partum Day 2  Subjective:  Melissa Thomas is a 36 y.o. [id number] 8335w4d s/p NSVD.  No acute events overnight.  Pt denies problems with ambulating, voiding or po intake.  She denies nausea or vomiting.  Pain is well controlled.  She has had flatus. She has had bowel movement.  Lochia Minimal.  Plan for birth control is Depo-Provera.  Method of Feeding: breast and bottle feeding  Objective: BP 108/72 (BP Location: Left Arm)   Pulse 89   Temp 98.7 F (37.1 C) (Oral)   Resp 14   Ht 5\' 1"  (1.549 m)   Wt 60.3 kg (133 lb)   LMP 08/28/2015 (Approximate)   SpO2 99%   Breastfeeding? Unknown   BMI 25.13 kg/m   Physical Exam:  General: alert, cooperative and no distress Chest: CTAB Heart: RRR, no m/r/g Abdomen: soft, nontender Uterine Fundus: fundus firm below umbilicus DVT Evaluation: No evidence of DVT seen on physical exam. Extremities: no LE edema   Recent Labs  05/30/16 1620  HGB 12.0  HCT 36.3    Assessment/Plan:  ASSESSMENT: Melissa Thomas is a 36 y.o. G2P2002 7235w4d ppd #2 NSVD, doing well. Patient with mild pre-eclampsia with BP within normal limits.  Discharge home   LOS: 3 days   Ivan AnchorsJohn [facility] 06/02/2016, 7:27 AM

## 2016-06-03 NOTE — Lactation Note (Signed)
Lactation Consultation Note Mom called d/t pain in breast. To painful to pump, hand express, or BF d/t cut to base of Lt. Nipple. Explained to mom that getting the milk out is the only way to relieve pain. Mom is using SNS for supplementing.  Discussed different ways to express milk, soften breast, assist w/relieving engorgement. Encouraged ICE, laying flat w/arms over head, occasionally massage upright towards axillary to soften breast some. When sitting apply cabbage and supportive bra. Discussed wearing cabbage to much will decrease milk supply. Mom has coconut oil, encouraged to apply to nipples. Mom was having chills and breast pain. FOB called and talked w/C. Lawernce RN who gave information. Mom stated that was helpful.  Encouraged mom to BF, hand massage, shower to relieve breast milk, but know more will come. Encouraged to call MD if not improved. Encouraged to call for f/u appt. W/LC. Patient Name: Melissa Thomas Reason for consult: Follow-up assessment;Breast/nipple pain   Maternal Data    Feeding    LATCH Score/Interventions                      Lactation Tools Discussed/Used     Consult Status Consult Status: Complete Date: 06/03/16 Follow-up type: Telephone Call    Charyl DancerCARVER, Alphonso Gregson G Thomas, 11:28 PM

## 2016-06-04 NOTE — Progress Notes (Addendum)
Jun 03, 2016 @ 2055 Received message from S.Hedrick regarding this patient needing follow up by lactation and would I call to check on her now @ 2050, as Lactation has been downsized from 7p-11p tonight. I spoke with this patient regarding her complaint of breast fullness, discomfort and inability to express milk. Patient also complains of occasional cold chills this evening and "cut" under right nipple.  I did describe for the patient that she would need to follow a regimen of ibuprofen as prescribed by her physician and cold therapy to both breast, along with hand expression/pumping to relieve the fullness of her breast tissue; until able to comfortably latch her infant.  I further recommended that she should monitor her temperature and call her physician if it reaches 100.5 or return to MAU if condition worsens. I did establish with this patient that a follow up phone call would be made at 2300 [facility], she was agreeable to that. Patient was accepting of all information provided.

## 2016-06-05 ENCOUNTER — Encounter: Payer: BLUE CROSS/BLUE SHIELD | Admitting: Obstetrics and Gynecology

## 2016-06-07 DIAGNOSIS — Z3483 Encounter for supervision of other normal pregnancy, third trimester: Secondary | ICD-10-CM | POA: Diagnosis not present

## 2016-06-07 DIAGNOSIS — Z3482 Encounter for supervision of other normal pregnancy, second trimester: Secondary | ICD-10-CM | POA: Diagnosis not present

## 2016-06-10 ENCOUNTER — Inpatient Hospital Stay (HOSPITAL_COMMUNITY): Payer: BLUE CROSS/BLUE SHIELD

## 2016-06-11 ENCOUNTER — Emergency Department (HOSPITAL_BASED_OUTPATIENT_CLINIC_OR_DEPARTMENT_OTHER)
Admission: EM | Admit: 2016-06-11 | Discharge: 2016-06-11 | Disposition: A | Discharge status: Home / Self Care | Payer: BLUE CROSS/BLUE SHIELD | Attending: Emergency Medicine | Admitting: Emergency Medicine

## 2016-06-11 ENCOUNTER — Encounter (HOSPITAL_BASED_OUTPATIENT_CLINIC_OR_DEPARTMENT_OTHER): Payer: Self-pay | Admitting: Emergency Medicine

## 2016-06-11 DIAGNOSIS — M7989 Other specified soft tissue disorders: Secondary | ICD-10-CM | POA: Diagnosis not present

## 2016-06-11 DIAGNOSIS — W4904XA Ring or other jewelry causing external constriction, initial encounter: Secondary | ICD-10-CM | POA: Insufficient documentation

## 2016-06-11 NOTE — ED Provider Notes (Signed)
[facility] Provider Note   CSN: [id number] Arrival date & time: 06/11/16  2157  By signing my name below, I, Deland PrettySherilynn Knight, attest that this documentation has been prepared under the direction and in the presence of Rise MuKenneth T. Leaphart, PA-C. Electronically Signed: Deland PrettySherilynn Knight, ED Scribe. 06/11/16. 10:29 PM.  History   Chief Complaint Chief Complaint  Patient presents with  . Hand Pain   The history is provided by the patient. No language interpreter was used.   HPI Comments: Melissa Thomas is a 36 y.o. female who presents to the Emergency Department complaining of moderate, gradually worsening left ring finger swelling. She reports that she had a baby and has been  gaining weight since. Per husband, the engagement ring has been on her finger for approximately 10 years.The pt denies numbness, injury, pain, weakness.   Past Medical History:  Diagnosis Date  . Medical history non-contributory     Patient Active Problem List   Diagnosis Date Noted  . Pre-eclampsia 05/31/2016  . Cholestasis of pregnancy 05/30/2016  . Cholestasis of pregnancy in third trimester 05/30/2016  . Supervision of normal pregnancy, antepartum 01/30/2016    Past Surgical History:  Procedure Laterality Date  . NO PAST SURGERIES      OB History    Gravida Para Term Preterm AB Living   2 2 2     2    SAB TAB Ectopic Multiple Live Births         0 2       Home Medications    Prior to Admission medications   Medication Sig Start Date End Date Taking? Authorizing Provider  diphenhydrAMINE (BENADRYL) 2 % cream Apply 1 application topically 2 (two) times daily as needed for itching.    [provider]  ibuprofen (ADVIL,MOTRIN) 600 MG tablet Take 1 tablet (600 mg total) by mouth every 6 (six) hours as needed. 06/02/16   Arabella MerlesShaw, Kimberly D, CNM  Prenat-FeAsp-Meth-FA-DHA w/o A (PRENATE PIXIE) 10-0.6-0.4-200 MG CAPS Take 1 tablet by mouth daily. 01/30/16   Roe Coombsenney, Rachelle A, CNM     Family History Family History  Problem Relation Age of Onset  . Hypertension Father     Social History Social History  Substance Use Topics  . Smoking status: Never Smoker  . Smokeless tobacco: Never Used  . Alcohol use 0.0 oz/week     Allergies   Patient has no known allergies.   Review of Systems Review of Systems  Constitutional: Negative for fever.  Musculoskeletal: Positive for joint swelling.  Neurological: Negative for numbness.     Physical Exam Updated Vital Signs BP (!) 139/92   Pulse 73   Temp 98.2 F (36.8 C) (Oral)   Resp 18   Ht 5\' 1"  (1.549 m)   Wt 133 lb (60.3 kg)   LMP 08/28/2015 (Approximate)   SpO2 99%   BMI 25.13 kg/m   Physical Exam  Constitutional: She is oriented to person, place, and time. She appears well-developed and well-nourished. No distress.  HENT:  Head: Normocephalic and atraumatic.  Eyes: EOM are normal.  Neck: Normal range of motion.  Pulmonary/Chest: Effort normal.  Musculoskeletal: Normal range of motion.  Ring to the left ring finger with distal swelling. Unable to remove ring due to swelling. Mild erythema. Full range of motion. Sensation intact to sharp dull. Cap refill normal. Radial pulses 2+ bilaterally.  Neurological: She is alert and oriented to person, place, and time.  Skin: Skin is warm and dry. Capillary refill takes  less than 2 seconds.  Psychiatric: She has a normal mood and affect.  Nursing note and vitals reviewed.    ED Treatments / Results   DIAGNOSTIC STUDIES: Oxygen Saturation is 99% on RA, normal by my interpretation.   COORDINATION OF CARE: 10:13 PM-Discussed next steps with pt. Pt verbalized understanding and is agreeable with the plan.   Labs (all labs ordered are listed, but only abnormal results are displayed) Labs Reviewed - No data to display  EKG  EKG Interpretation None       Radiology No results found.  Procedures Procedures (including critical care time)  Ring  Removed from left ring finger with ring cutter. Patient tolerated without difficulties.  Medications Ordered in ED Medications - No data to display   Initial Impression / Assessment and Plan / ED Course  I have reviewed the triage vital signs and the nursing notes.  Pertinent labs & imaging results that were available during my care of the patient were reviewed by me and considered in my medical decision making (see chart for details).     Patient presents to the ED with not able to remove her left ring due to swelling after pregnancy. No trauma. Ring removed to the ED with ring cutter. Patient is neurovascularly intact. Full range of motion. Patient has no other complaints at this time. Pt is hemodynamically stable, in NAD, & able to ambulate in the ED. Pain has been managed & has no complaints prior to dc. Pt is comfortable with above plan and is stable for discharge at this time. All questions were answered prior to disposition. Strict return precautions for f/u to the ED were discussed.   Final Clinical Impressions(s) / ED Diagnoses   Final diagnoses:  Ring or other jewelry causing external constriction, initial encounter    New Prescriptions Discharge Medication List as of 06/11/2016 10:29 PM     I personally performed the services described in this documentation, which was scribed in my presence. The recorded information has been reviewed and is accurate.     Rise Mu, PA-C 06/12/16 1529    Pricilla Loveless, MD 06/14/16 0040

## 2016-06-11 NOTE — ED Triage Notes (Signed)
Ring on left finger needs to be removed

## 2016-06-11 NOTE — ED Notes (Signed)
want ring cut off left ring finger, pt  Tried  Oil and string method to remove but unable to do so,  Increased swelling and bruising to knuckle

## 2016-06-12 ENCOUNTER — Encounter: Payer: BLUE CROSS/BLUE SHIELD | Admitting: Obstetrics and Gynecology

## 2016-06-21 ENCOUNTER — Other Ambulatory Visit: Payer: Self-pay | Admitting: Certified Nurse Midwife

## 2016-07-09 ENCOUNTER — Encounter: Payer: Self-pay | Admitting: Obstetrics and Gynecology

## 2016-07-09 ENCOUNTER — Ambulatory Visit (INDEPENDENT_AMBULATORY_CARE_PROVIDER_SITE_OTHER): Payer: BLUE CROSS/BLUE SHIELD | Admitting: Obstetrics and Gynecology

## 2016-07-09 DIAGNOSIS — N898 Other specified noninflammatory disorders of vagina: Secondary | ICD-10-CM | POA: Diagnosis not present

## 2016-07-09 DIAGNOSIS — Z3480 Encounter for supervision of other normal pregnancy, unspecified trimester: Secondary | ICD-10-CM

## 2016-07-09 DIAGNOSIS — Z3042 Encounter for surveillance of injectable contraceptive: Secondary | ICD-10-CM

## 2016-07-09 MED ORDER — MEDROXYPROGESTERONE ACETATE 150 MG/ML IM SUSP
150.0000 mg | Freq: Once | INTRAMUSCULAR | Status: AC
  Administered 2016-07-09: 150 mg via INTRAMUSCULAR

## 2016-07-09 NOTE — Progress Notes (Signed)
Post Partum Exam  Bilan Melissa Thomas is a 36 y.o. 962P2002 female who presents for a postpartum visit. She is 5 weeks postpartum following a spontaneous vaginal delivery. I have fully reviewed the prenatal and intrapartum course. The delivery was at 39 gestational weeks.  Anesthesia: epidural. Postpartum course has been uncomplicated. Baby's course has been uncomplicated. Baby is feeding by Bottle Similac Soy. Bleeding no bleeding. Bowel function is normal. Bladder function is normal. Patient is not sexually active. Contraception method is none. Postpartum depression screening:neg     Review of Systems Pertinent items are noted in HPI.    Objective:  unknown if currently breastfeeding.  General:  alert, cooperative and no distress   Breasts:  inspection negative, no nipple discharge or bleeding, no masses or nodularity palpable  Lungs: clear to auscultation bilaterally  Heart:  regular rate and rhythm  Abdomen: soft, non-tender; bowel sounds normal; no masses,  no organomegaly   Vulva:  normal  Vagina: normal vagina, no discharge, exudate, lesion, or erythema  Cervix:  multiparous appearance  Corpus: normal size, contour, position, consistency, mobility, non-tender  Adnexa:  no mass, fullness, tenderness  Rectal Exam: Not performed.        Assessment:    Normal postpartum exam. Pap smear not done at today's visit.   Plan:   1. Contraception: Depo-Provera injections 2. Patient is medically cleared to resume all activities of daily living 3. Follow up in: 6 months for annual exam or as needed.

## 2016-07-10 LAB — CERVICOVAGINAL ANCILLARY ONLY
BACTERIAL VAGINITIS: POSITIVE — AB
CANDIDA VAGINITIS: NEGATIVE

## 2016-07-12 ENCOUNTER — Other Ambulatory Visit: Payer: Self-pay | Admitting: Obstetrics and Gynecology

## 2016-07-12 MED ORDER — METRONIDAZOLE 500 MG PO TABS: 500.0000 mg | ORAL_TABLET | Freq: Two times a day (BID) | ORAL | 0 refills | Status: DC

## 2016-08-22 ENCOUNTER — Other Ambulatory Visit: Payer: Self-pay | Admitting: Obstetrics and Gynecology

## 2016-08-22 ENCOUNTER — Telehealth: Payer: Self-pay | Admitting: *Deleted

## 2016-08-22 MED ORDER — NORGESTIMATE-ETH ESTRADIOL 0.25-35 MG-MCG PO TABS: 1.0000 | ORAL_TABLET | Freq: Every day | ORAL | 11 refills | Status: DC

## 2016-08-22 NOTE — Telephone Encounter (Signed)
Patient would like to change her birth control. She reports she has been bleeding daily with the Depo provera. Her next shot is due 9/17. She would prefer to take OCP instead.

## 2016-08-23 ENCOUNTER — Other Ambulatory Visit: Payer: Self-pay | Admitting: *Deleted

## 2016-08-23 DIAGNOSIS — Z30011 Encounter for initial prescription of contraceptive pills: Secondary | ICD-10-CM

## 2016-08-23 MED ORDER — NORGESTIMATE-ETH ESTRADIOL 0.25-35 MG-MCG PO TABS: 1.0000 | ORAL_TABLET | Freq: Every day | ORAL | 11 refills | Status: DC

## 2016-08-23 NOTE — Telephone Encounter (Signed)
Patient notified

## 2016-09-24 ENCOUNTER — Encounter: Payer: Self-pay | Admitting: Obstetrics and Gynecology

## 2016-09-24 ENCOUNTER — Ambulatory Visit: Payer: BLUE CROSS/BLUE SHIELD

## 2016-09-24 ENCOUNTER — Ambulatory Visit (INDEPENDENT_AMBULATORY_CARE_PROVIDER_SITE_OTHER): Payer: BLUE CROSS/BLUE SHIELD | Admitting: Obstetrics and Gynecology

## 2016-09-24 VITALS — BP 112/76 | HR 78 | Ht 61.0 in | Wt 121.0 lb

## 2016-09-24 DIAGNOSIS — N921 Excessive and frequent menstruation with irregular cycle: Secondary | ICD-10-CM

## 2016-09-24 NOTE — Progress Notes (Signed)
36 yo G2P2 here for the evaluation of DUb following depo-provera administration in July. Patient reports daily vaginal bleeding at times heavy like a period but mostly consisting of daily spotting for which she has to wear a panty liner. She denies any abnormal discharge or pelvic pain. Patient called requesting COC Rx in August but never started it. Patient is without any other complaints. She is not breastfeeding  Past Medical History:  Diagnosis Date  . Medical history non-contributory    Past Surgical History:  Procedure Laterality Date  . NO PAST SURGERIES     Family History  Problem Relation Age of Onset  . Hypertension Father    Social History  Substance Use Topics  . Smoking status: Never Smoker  . Smokeless tobacco: Never Used  . Alcohol use 0.0 oz/week   ROS See pertinent in HPI  Blood pressure 112/76, pulse 78, height  (1.549 m), weight 121 lb (54.9 kg), last menstrual period 07/25/2016, not currently breastfeeding. GENERAL: Well-developed, well-nourished female in no acute distress.  ABDOMEN: Soft, nontender, nondistended. PELVIC: Declined by patient due to vaginal bleeding EXTREMITIES: No cyanosis, clubbing, or edema, 2+ distal pulses.  A/P 36 yo G2P2 with DUB associated with depo-provera - Discussed with patient that irregular bleeding is expected following the first dose of depo-provera.  - She desires to change her contraception to COC. Rx sprinted previously prescribed. Patient advised to take it for 3 months - RTC prn

## 2017-04-07 IMAGING — US US MFM OB FOLLOW-UP
1 series · 14 of 28 positions shown · non-contrast
Comparison: none

[Series 1: us mfm ob follow-up · 14 of 53 slices shown]
[im 2/53]
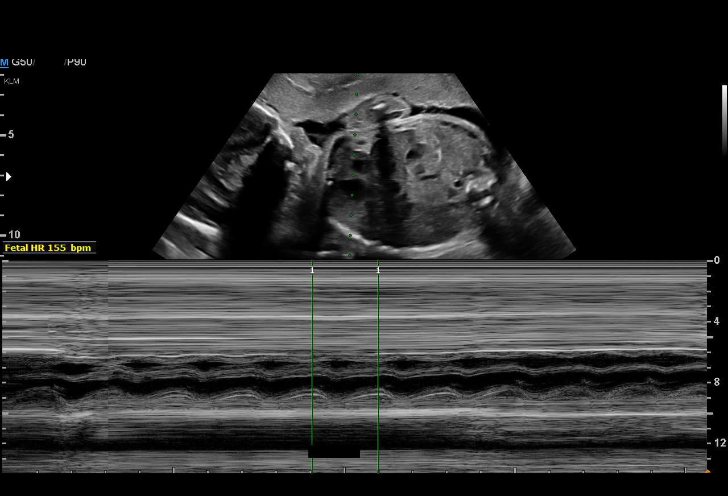
[im 6/53]
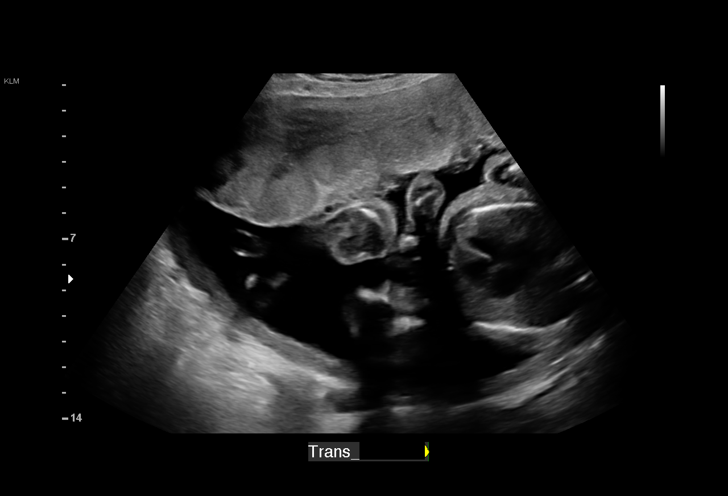
[im 10/53]
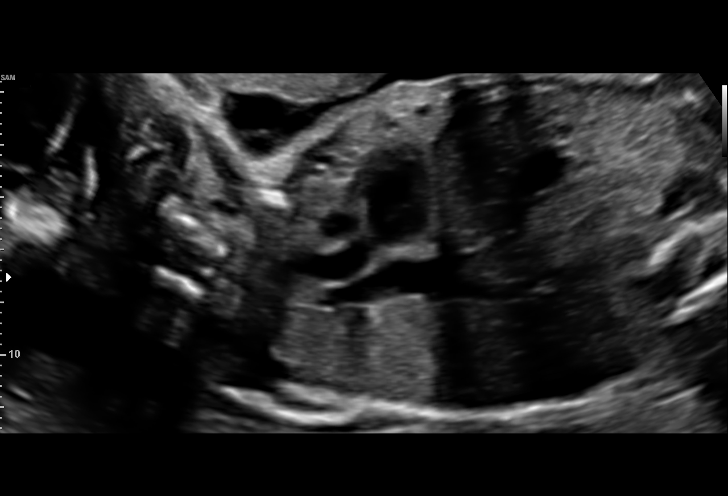
[im 14/53]
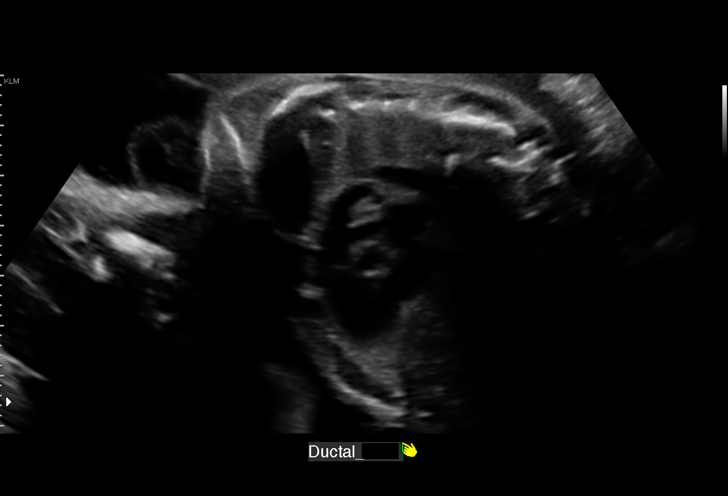
[im 18/53]
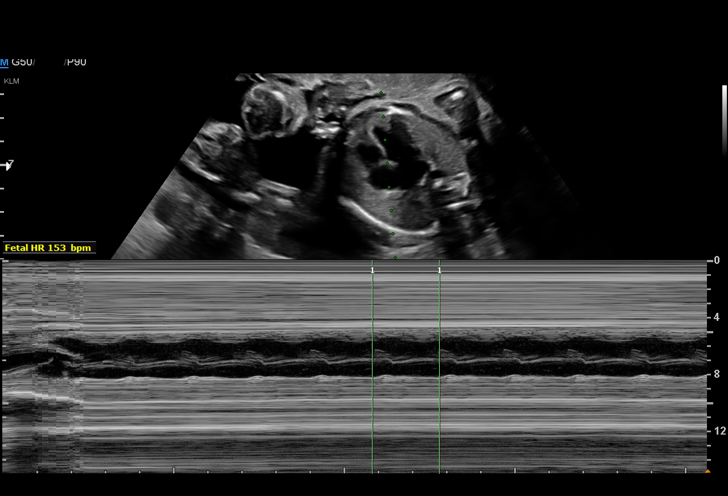
[im 22/53]
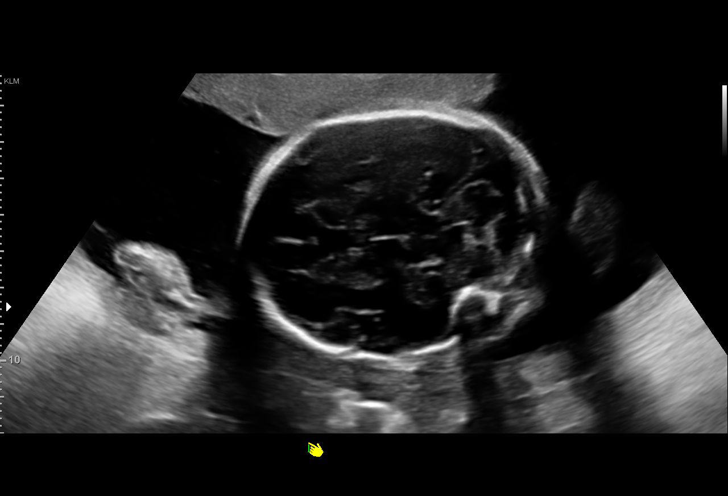
[im 26/53]
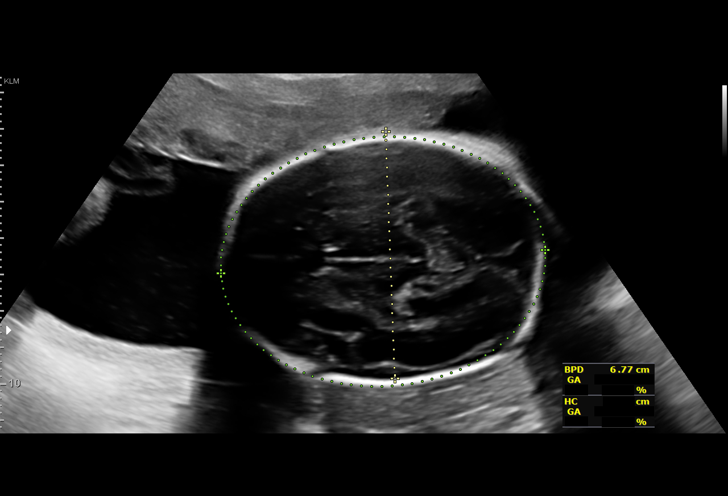
[im 29/53]
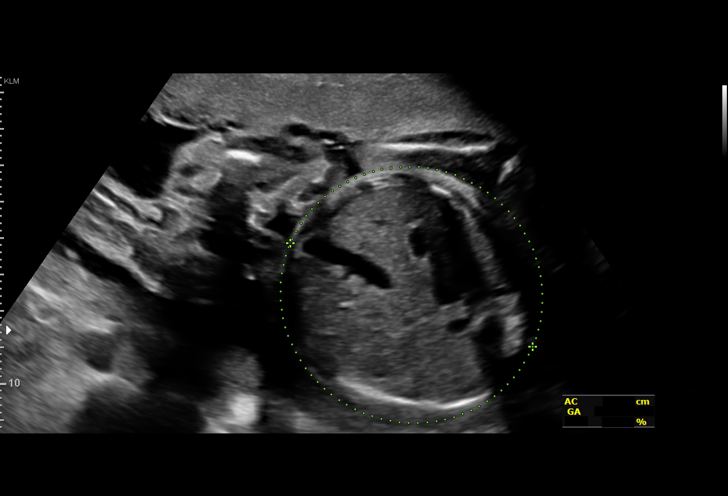
[im 33/53]
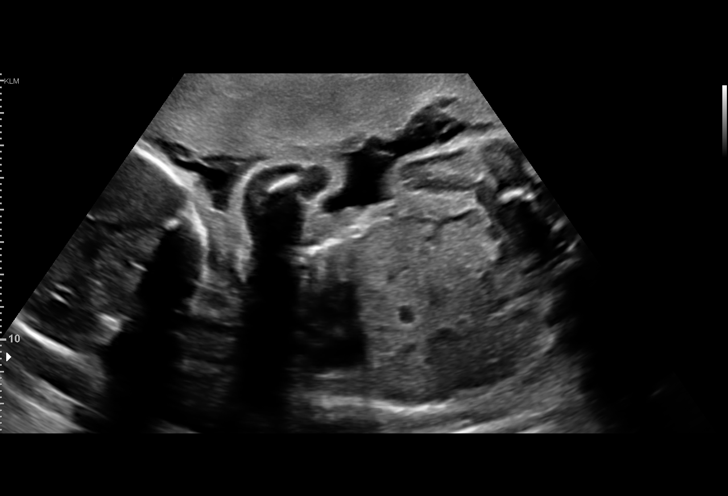
[im 37/53]
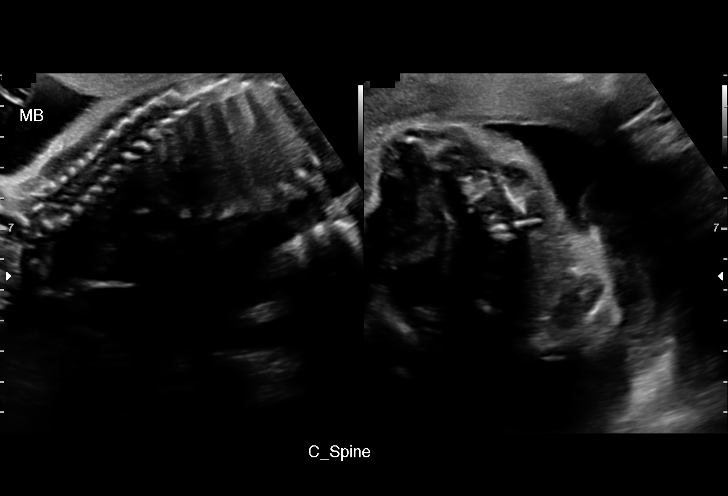
[im 41/53]
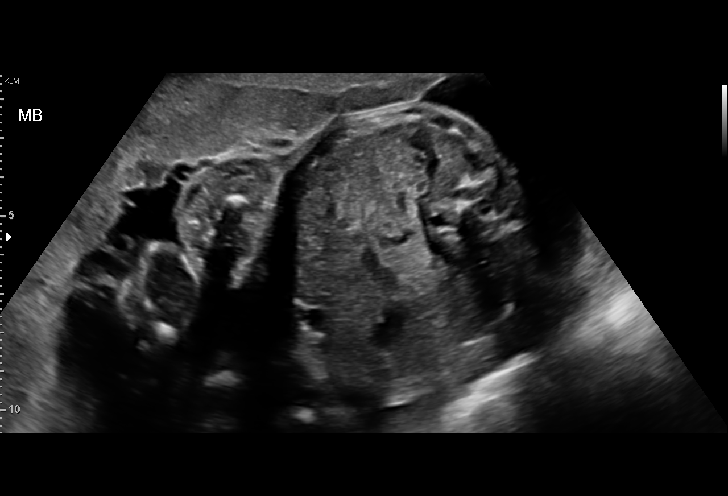
[im 45/53]
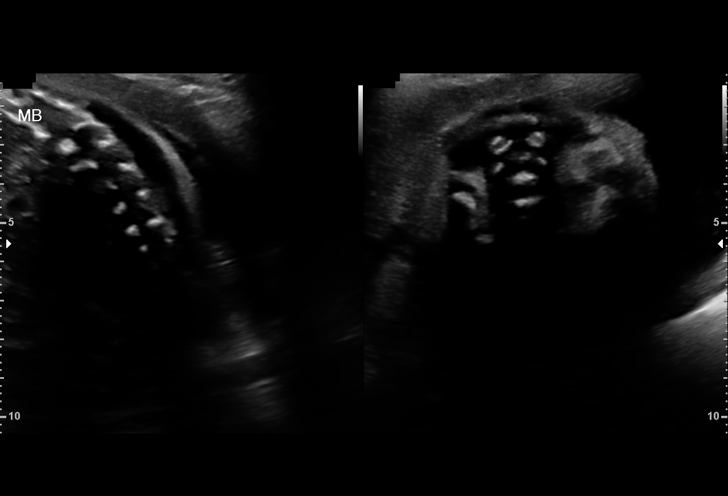
[im 49/53]
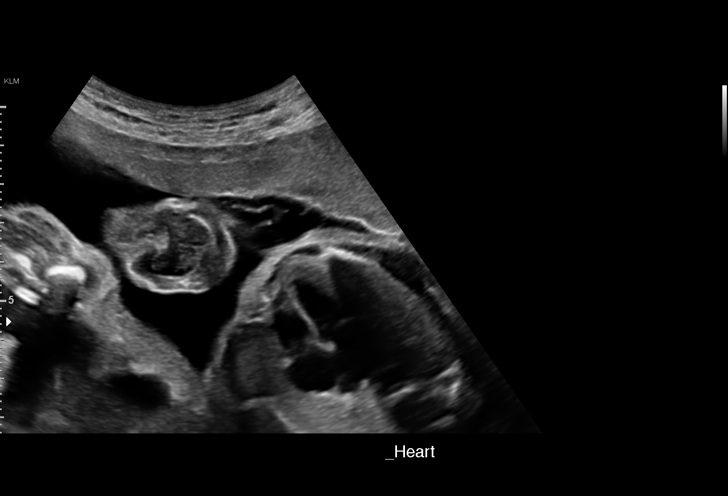
[im 53/53]
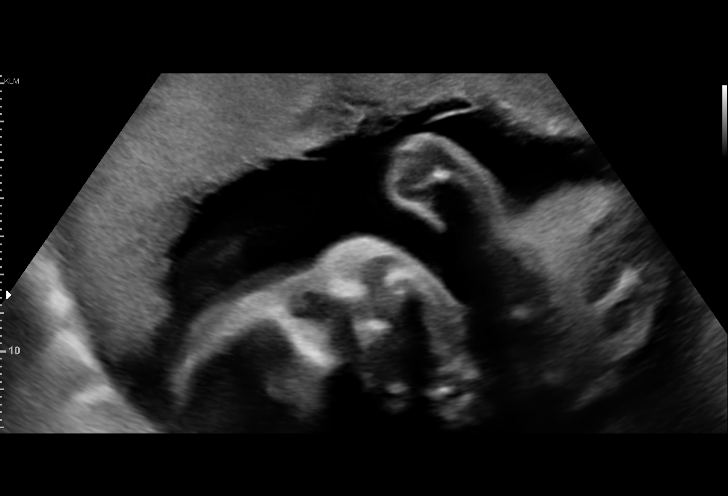

[14 of 28 positions shown; findings below may reference images not displayed]

Road [[email or web address]]

Indications

26 weeks gestation of pregnancy
Antenatal follow-up for nonvisualized fetal
anatomy
OB History

Gravidity:    2         Term:   1
Living:       1
Fetal Evaluation

Num Of Fetuses:     1
Fetal Heart         155
Rate(bpm):
Cardiac Activity:   Observed
Presentation:       Breech
Placenta:           Anterior, above cervical os
P. Cord Insertion:  Previously Visualized

Amniotic Fluid
AFI FV:      Subjectively within normal limits

Largest Pocket(cm)
7.8
Biometry

BPD:        68  mm     G. Age:  27w 3d         57  %    CI:         71.2   %   70 - 86
FL/HC:      18.9   %   18.6 -
HC:      256.7  mm     G. Age:  27w 6d         57  %    HC/AC:      1.18       1.05 -
AC:      218.2  mm     G. Age:  26w 2d         25  %    FL/BPD:     71.3   %   71 - 87
FL:       48.5  mm     G. Age:  26w 2d         20  %    FL/AC:      22.2   %   20 - 24
Est. FW:     944  gm      2 lb 1 oz     46  %
Gestational Age

U/S Today:     27w 0d                                        EDD:   06/04/16
Best:          26w 6d    Det. By:   U/S  (02/01/16)          EDD:   06/05/16
Anatomy

Cranium:               Appears normal         Aortic Arch:            Appears normal
Cavum:                 Appears normal         Ductal Arch:            Appears normal
Ventricles:            Appears normal         Diaphragm:              Appears normal
Choroid Plexus:        Appears normal         Stomach:                Appears normal, left
sided
Cerebellum:            Appears normal         Abdomen:                Appears normal
Posterior Fossa:       Previously seen        Abdominal Wall:         Previously seen
Nuchal Fold:           Previously seen        Cord Vessels:           Previously seen
Face:                  Orbits and profile     Kidneys:                Appear normal
previously seen
Lips:                  Appears normal         Bladder:                Appears normal
Thoracic:              Appears normal         Spine:                  Previously seen
Heart:                 Appears normal         Upper Extremities:      Previously seen
(4CH, axis, and
situs)
RVOT:                  Appears normal         Lower Extremities:      Previously seen
LVOT:                  Appears normal

Other:  Fetus appears to be a male. Heels and 5th digit previously visualized.
Cervix Uterus Adnexa

Cervix
Length:            3.5  cm.
Normal appearance by transabdominal scan.
Impression

Singleton intrauterine pregnancy at 26+6, here to complete
anatomic survey
Review of the anatomy shows no sonographic markers for
aneuploidy or structural anomalies
all relevant anatomy has been visualized
Amniotic fluid volume is normal
EFW is 944g, the 46th percentile for growth
Recommendations

Normal fetal growth and development. Follow-up ultrasounds
as clinically indicated.

## 2017-07-01 DIAGNOSIS — H5213 Myopia, bilateral: Secondary | ICD-10-CM | POA: Diagnosis not present

## 2017-10-22 ENCOUNTER — Other Ambulatory Visit: Payer: Self-pay

## 2017-10-22 ENCOUNTER — Encounter (HOSPITAL_COMMUNITY): Payer: Self-pay | Admitting: Emergency Medicine

## 2017-10-22 ENCOUNTER — Emergency Department (HOSPITAL_COMMUNITY)
Admission: EM | Admit: 2017-10-22 | Discharge: 2017-10-22 | Disposition: A | Discharge status: Home / Self Care | Payer: BLUE CROSS/BLUE SHIELD | Attending: Emergency Medicine | Admitting: Emergency Medicine

## 2017-10-22 DIAGNOSIS — R319 Hematuria, unspecified: Secondary | ICD-10-CM | POA: Diagnosis present

## 2017-10-22 DIAGNOSIS — R300 Dysuria: Secondary | ICD-10-CM

## 2017-10-22 DIAGNOSIS — N3001 Acute cystitis with hematuria: Secondary | ICD-10-CM | POA: Insufficient documentation

## 2017-10-22 LAB — URINALYSIS, ROUTINE W REFLEX MICROSCOPIC
Bilirubin Urine: NEGATIVE
Glucose, UA: NEGATIVE mg/dL
KETONES UR: 5 mg/dL — AB
Nitrite: NEGATIVE
PROTEIN: NEGATIVE mg/dL
Specific Gravity, Urine: 1.003 — ABNORMAL LOW (ref 1.005–1.030)
pH: 6 (ref 5.0–8.0)

## 2017-10-22 LAB — PREGNANCY, URINE: PREG TEST UR: NEGATIVE

## 2017-10-22 MED ORDER — CEPHALEXIN 500 MG PO CAPS: 500.0000 mg | ORAL_CAPSULE | Freq: Two times a day (BID) | ORAL | 0 refills | Status: DC

## 2017-10-22 MED ORDER — PHENAZOPYRIDINE HCL 100 MG PO TABS
100.0000 mg | ORAL_TABLET | Freq: Once | ORAL | Status: AC
Start: 2017-10-22 — End: 2017-10-22
  Administered 2017-10-22: 100 mg via ORAL
  Filled 2017-10-22: qty 1

## 2017-10-22 MED ORDER — PHENAZOPYRIDINE HCL 200 MG PO TABS: 200.0000 mg | ORAL_TABLET | Freq: Three times a day (TID) | ORAL | 0 refills | Status: DC

## 2017-10-22 MED ORDER — CEPHALEXIN 500 MG PO CAPS
500.0000 mg | ORAL_CAPSULE | Freq: Once | ORAL | Status: AC
  Administered 2017-10-22: 500 mg via ORAL
  Filled 2017-10-22: qty 1

## 2017-10-22 NOTE — Discharge Instructions (Signed)
Take the prescribed medication as directed.  The pyridium will turn your urine bright red/orange in color which is normal. Follow-up with your primary care doctor. Return to the ED for new or worsening symptoms.

## 2017-10-22 NOTE — ED Notes (Signed)
Pt given specimen container for urine collection

## 2017-10-22 NOTE — ED Triage Notes (Signed)
Pt from home with c/o hematuria that began tonight and pain with urination that has been intermittent x 2 days

## 2017-10-22 NOTE — ED Provider Notes (Signed)
[facility]-EMERGENCY DEPT Provider Note   CSN: [id number] Arrival date & time: 10/22/17  0136     History   Chief Complaint Chief Complaint  Patient presents with  . Hematuria    HPI Melissa Thomas is a 37 y.o. female.  The history is provided by the patient and medical records.  Hematuria      37 y.o. F here with hematuria.  States for the past 2 days she has been having dysuria and urinary frequency, tonight started having some hematuria.  States some pinkish/red tint on toilet paper when wiping.  No fever, chills, flank pain, nausea, vomiting, diarrhea.  No meds tried PTA.  No pelvic pain or vaginal discharge.  Past Medical History:  Diagnosis Date  . Medical history non-contributory     Patient Active Problem List   Diagnosis Date Noted  . Pre-eclampsia 05/31/2016  . Cholestasis of pregnancy 05/30/2016  . Cholestasis of pregnancy in third trimester 05/30/2016  . Supervision of normal pregnancy, antepartum 01/30/2016    Past Surgical History:  Procedure Laterality Date  . NO PAST SURGERIES       OB History    Gravida  2   Para  2   Term  2   Preterm      AB      Living  2     SAB      TAB      Ectopic      Multiple  0   Live Births  2            Home Medications    Prior to Admission medications   Medication Sig Start Date End Date Taking? Authorizing Provider  ibuprofen (ADVIL,MOTRIN) 600 MG tablet Take 1 tablet (600 mg total) by mouth every 6 (six) hours as needed. 06/02/16   Arabella Merles, CNM  Prenat-FeAsp-Meth-FA-DHA w/o A (PRENATE PIXIE) 10-0.6-0.4-200 MG CAPS Take 1 tablet by mouth daily. 01/30/16   Roe Coombs, CNM    Family History Family History  Problem Relation Age of Onset  . Hypertension Father     Social History Social History   Tobacco Use  . Smoking status: Never Smoker  . Smokeless tobacco: Never Used  Substance Use Topics  . Alcohol use: Yes    Alcohol/week: 0.0 standard  drinks  . Drug use: No     Allergies   Patient has no known allergies.   Review of Systems Review of Systems  Genitourinary: Positive for dysuria, frequency and hematuria.  All other systems reviewed and are negative.    Physical Exam Updated Vital Signs BP (!) 133/94 (BP Location: Left Arm)   Temp 97.6 F (36.4 C) (Oral)   Resp 16   SpO2 100%   Physical Exam  Constitutional: She is oriented to person, place, and time. She appears well-developed and well-nourished.  HENT:  Head: Normocephalic and atraumatic.  Mouth/Throat: Oropharynx is clear and moist.  Eyes: Pupils are equal, round, and reactive to light. Conjunctivae and EOM are normal.  Neck: Normal range of motion.  Cardiovascular: Normal rate, regular rhythm and normal heart sounds.  Pulmonary/Chest: Effort normal and breath sounds normal. No stridor. No respiratory distress.  Abdominal: Soft. Bowel sounds are normal. There is no tenderness. There is no rebound.  Musculoskeletal: Normal range of motion.  Neurological: She is alert and oriented to person, place, and time.  Skin: Skin is warm and dry.  Psychiatric: She has a normal mood and affect.  Nursing note  and vitals reviewed.    ED Treatments / Results  Labs (all labs ordered are listed, but only abnormal results are displayed) Labs Reviewed  URINALYSIS, ROUTINE W REFLEX MICROSCOPIC - Abnormal; Notable for the following components:      Result Value   Color, Urine STRAW (*)    Specific Gravity, Urine 1.003 (*)    Hgb urine dipstick LARGE (*)    Ketones, ur 5 (*)    Leukocytes, UA LARGE (*)    Bacteria, UA RARE (*)    All other components within normal limits  PREGNANCY, URINE    EKG None  Radiology No results found.  Procedures Procedures (including critical care time)  Medications Ordered in ED Medications  cephALEXin (KEFLEX) capsule 500 mg (has no administration in time range)  phenazopyridine (PYRIDIUM) tablet 100 mg (has no  administration in time range)     Initial Impression / Assessment and Plan / ED Course  I have reviewed the triage vital signs and the nursing notes.  Pertinent labs & imaging results that were available during my care of the patient were reviewed by me and considered in my medical decision making (see chart for details).  37 year old female here with 2 days of dysuria and urinary frequency with now hematuria.  She is afebrile and nontoxic.  Abdomen is soft and benign.  No CVA tenderness.  UA appears infectious.  Will treat with course of Keflex and Pyridium.  Encourage good oral hydration.  Follow-up with PCP.  Return here for any new or worsening symptoms.  Final Clinical Impressions(s) / ED Diagnoses   Final diagnoses:  Acute cystitis with hematuria  Dysuria    ED Discharge Orders         Ordered    cephALEXin (KEFLEX) 500 MG capsule  2 times daily     10/22/17 0525    phenazopyridine (PYRIDIUM) 200 MG tablet  3 times daily     10/22/17 0525           Garlon Hatchet, PA-C 10/22/17 Harvie Junior    Zadie Rhine, MD 10/22/17 [phone]

## 2018-02-08 DIAGNOSIS — J101 Influenza due to other identified influenza virus with other respiratory manifestations: Secondary | ICD-10-CM | POA: Diagnosis not present

## 2018-03-26 DIAGNOSIS — M9905 Segmental and somatic dysfunction of pelvic region: Secondary | ICD-10-CM | POA: Diagnosis not present

## 2018-03-26 DIAGNOSIS — M9903 Segmental and somatic dysfunction of lumbar region: Secondary | ICD-10-CM | POA: Diagnosis not present

## 2018-03-26 DIAGNOSIS — M5386 Other specified dorsopathies, lumbar region: Secondary | ICD-10-CM | POA: Diagnosis not present

## 2018-03-26 DIAGNOSIS — M9901 Segmental and somatic dysfunction of cervical region: Secondary | ICD-10-CM | POA: Diagnosis not present

## 2018-04-01 DIAGNOSIS — M9903 Segmental and somatic dysfunction of lumbar region: Secondary | ICD-10-CM | POA: Diagnosis not present

## 2018-04-01 DIAGNOSIS — M5386 Other specified dorsopathies, lumbar region: Secondary | ICD-10-CM | POA: Diagnosis not present

## 2018-04-01 DIAGNOSIS — M9905 Segmental and somatic dysfunction of pelvic region: Secondary | ICD-10-CM | POA: Diagnosis not present

## 2018-04-01 DIAGNOSIS — M9901 Segmental and somatic dysfunction of cervical region: Secondary | ICD-10-CM | POA: Diagnosis not present

## 2018-04-02 DIAGNOSIS — M5386 Other specified dorsopathies, lumbar region: Secondary | ICD-10-CM | POA: Diagnosis not present

## 2018-04-02 DIAGNOSIS — M9903 Segmental and somatic dysfunction of lumbar region: Secondary | ICD-10-CM | POA: Diagnosis not present

## 2018-04-02 DIAGNOSIS — M9901 Segmental and somatic dysfunction of cervical region: Secondary | ICD-10-CM | POA: Diagnosis not present

## 2018-04-02 DIAGNOSIS — M9905 Segmental and somatic dysfunction of pelvic region: Secondary | ICD-10-CM | POA: Diagnosis not present

## 2018-04-04 DIAGNOSIS — M9905 Segmental and somatic dysfunction of pelvic region: Secondary | ICD-10-CM | POA: Diagnosis not present

## 2018-04-04 DIAGNOSIS — M5386 Other specified dorsopathies, lumbar region: Secondary | ICD-10-CM | POA: Diagnosis not present

## 2018-04-04 DIAGNOSIS — M9901 Segmental and somatic dysfunction of cervical region: Secondary | ICD-10-CM | POA: Diagnosis not present

## 2018-04-04 DIAGNOSIS — M9903 Segmental and somatic dysfunction of lumbar region: Secondary | ICD-10-CM | POA: Diagnosis not present

## 2018-04-07 DIAGNOSIS — M9903 Segmental and somatic dysfunction of lumbar region: Secondary | ICD-10-CM | POA: Diagnosis not present

## 2018-04-07 DIAGNOSIS — M5386 Other specified dorsopathies, lumbar region: Secondary | ICD-10-CM | POA: Diagnosis not present

## 2018-04-07 DIAGNOSIS — M9901 Segmental and somatic dysfunction of cervical region: Secondary | ICD-10-CM | POA: Diagnosis not present

## 2018-04-07 DIAGNOSIS — M9905 Segmental and somatic dysfunction of pelvic region: Secondary | ICD-10-CM | POA: Diagnosis not present

## 2018-04-09 DIAGNOSIS — M9903 Segmental and somatic dysfunction of lumbar region: Secondary | ICD-10-CM | POA: Diagnosis not present

## 2018-04-09 DIAGNOSIS — M5386 Other specified dorsopathies, lumbar region: Secondary | ICD-10-CM | POA: Diagnosis not present

## 2018-04-09 DIAGNOSIS — M9901 Segmental and somatic dysfunction of cervical region: Secondary | ICD-10-CM | POA: Diagnosis not present

## 2018-04-09 DIAGNOSIS — M9905 Segmental and somatic dysfunction of pelvic region: Secondary | ICD-10-CM | POA: Diagnosis not present

## 2018-04-11 DIAGNOSIS — M5386 Other specified dorsopathies, lumbar region: Secondary | ICD-10-CM | POA: Diagnosis not present

## 2018-04-11 DIAGNOSIS — M9905 Segmental and somatic dysfunction of pelvic region: Secondary | ICD-10-CM | POA: Diagnosis not present

## 2018-04-11 DIAGNOSIS — M9901 Segmental and somatic dysfunction of cervical region: Secondary | ICD-10-CM | POA: Diagnosis not present

## 2018-04-11 DIAGNOSIS — M9903 Segmental and somatic dysfunction of lumbar region: Secondary | ICD-10-CM | POA: Diagnosis not present

## 2018-04-16 DIAGNOSIS — M5386 Other specified dorsopathies, lumbar region: Secondary | ICD-10-CM | POA: Diagnosis not present

## 2018-04-16 DIAGNOSIS — M9905 Segmental and somatic dysfunction of pelvic region: Secondary | ICD-10-CM | POA: Diagnosis not present

## 2018-04-16 DIAGNOSIS — M9901 Segmental and somatic dysfunction of cervical region: Secondary | ICD-10-CM | POA: Diagnosis not present

## 2018-04-16 DIAGNOSIS — M9903 Segmental and somatic dysfunction of lumbar region: Secondary | ICD-10-CM | POA: Diagnosis not present

## 2018-04-22 DIAGNOSIS — M9903 Segmental and somatic dysfunction of lumbar region: Secondary | ICD-10-CM | POA: Diagnosis not present

## 2018-04-22 DIAGNOSIS — M5386 Other specified dorsopathies, lumbar region: Secondary | ICD-10-CM | POA: Diagnosis not present

## 2018-04-22 DIAGNOSIS — M9905 Segmental and somatic dysfunction of pelvic region: Secondary | ICD-10-CM | POA: Diagnosis not present

## 2018-04-22 DIAGNOSIS — M9901 Segmental and somatic dysfunction of cervical region: Secondary | ICD-10-CM | POA: Diagnosis not present

## 2018-04-25 DIAGNOSIS — M9905 Segmental and somatic dysfunction of pelvic region: Secondary | ICD-10-CM | POA: Diagnosis not present

## 2018-04-25 DIAGNOSIS — M9903 Segmental and somatic dysfunction of lumbar region: Secondary | ICD-10-CM | POA: Diagnosis not present

## 2018-04-25 DIAGNOSIS — M9901 Segmental and somatic dysfunction of cervical region: Secondary | ICD-10-CM | POA: Diagnosis not present

## 2018-04-25 DIAGNOSIS — M5386 Other specified dorsopathies, lumbar region: Secondary | ICD-10-CM | POA: Diagnosis not present

## 2018-05-02 DIAGNOSIS — M9905 Segmental and somatic dysfunction of pelvic region: Secondary | ICD-10-CM | POA: Diagnosis not present

## 2018-05-02 DIAGNOSIS — M9903 Segmental and somatic dysfunction of lumbar region: Secondary | ICD-10-CM | POA: Diagnosis not present

## 2018-05-02 DIAGNOSIS — M9901 Segmental and somatic dysfunction of cervical region: Secondary | ICD-10-CM | POA: Diagnosis not present

## 2018-05-02 DIAGNOSIS — M5386 Other specified dorsopathies, lumbar region: Secondary | ICD-10-CM | POA: Diagnosis not present

## 2018-05-16 DIAGNOSIS — M9905 Segmental and somatic dysfunction of pelvic region: Secondary | ICD-10-CM | POA: Diagnosis not present

## 2018-05-16 DIAGNOSIS — M9901 Segmental and somatic dysfunction of cervical region: Secondary | ICD-10-CM | POA: Diagnosis not present

## 2018-05-16 DIAGNOSIS — M5386 Other specified dorsopathies, lumbar region: Secondary | ICD-10-CM | POA: Diagnosis not present

## 2018-05-16 DIAGNOSIS — M9903 Segmental and somatic dysfunction of lumbar region: Secondary | ICD-10-CM | POA: Diagnosis not present

## 2018-06-06 DIAGNOSIS — M9903 Segmental and somatic dysfunction of lumbar region: Secondary | ICD-10-CM | POA: Diagnosis not present

## 2018-06-06 DIAGNOSIS — M9905 Segmental and somatic dysfunction of pelvic region: Secondary | ICD-10-CM | POA: Diagnosis not present

## 2018-06-06 DIAGNOSIS — M9901 Segmental and somatic dysfunction of cervical region: Secondary | ICD-10-CM | POA: Diagnosis not present

## 2018-06-06 DIAGNOSIS — M5386 Other specified dorsopathies, lumbar region: Secondary | ICD-10-CM | POA: Diagnosis not present

## 2018-06-25 DIAGNOSIS — M9905 Segmental and somatic dysfunction of pelvic region: Secondary | ICD-10-CM | POA: Diagnosis not present

## 2018-06-25 DIAGNOSIS — M9903 Segmental and somatic dysfunction of lumbar region: Secondary | ICD-10-CM | POA: Diagnosis not present

## 2018-06-25 DIAGNOSIS — M5386 Other specified dorsopathies, lumbar region: Secondary | ICD-10-CM | POA: Diagnosis not present

## 2018-06-25 DIAGNOSIS — M9901 Segmental and somatic dysfunction of cervical region: Secondary | ICD-10-CM | POA: Diagnosis not present

## 2018-07-23 DIAGNOSIS — M9901 Segmental and somatic dysfunction of cervical region: Secondary | ICD-10-CM | POA: Diagnosis not present

## 2018-07-23 DIAGNOSIS — M9905 Segmental and somatic dysfunction of pelvic region: Secondary | ICD-10-CM | POA: Diagnosis not present

## 2018-07-23 DIAGNOSIS — M5386 Other specified dorsopathies, lumbar region: Secondary | ICD-10-CM | POA: Diagnosis not present

## 2018-07-23 DIAGNOSIS — M9903 Segmental and somatic dysfunction of lumbar region: Secondary | ICD-10-CM | POA: Diagnosis not present

## 2018-08-20 ENCOUNTER — Other Ambulatory Visit: Payer: Self-pay

## 2018-08-20 DIAGNOSIS — Z20822 Contact with and (suspected) exposure to covid-19: Secondary | ICD-10-CM

## 2018-08-20 DIAGNOSIS — M9901 Segmental and somatic dysfunction of cervical region: Secondary | ICD-10-CM | POA: Diagnosis not present

## 2018-08-20 DIAGNOSIS — M5386 Other specified dorsopathies, lumbar region: Secondary | ICD-10-CM | POA: Diagnosis not present

## 2018-08-20 DIAGNOSIS — M9905 Segmental and somatic dysfunction of pelvic region: Secondary | ICD-10-CM | POA: Diagnosis not present

## 2018-08-20 DIAGNOSIS — M9903 Segmental and somatic dysfunction of lumbar region: Secondary | ICD-10-CM | POA: Diagnosis not present

## 2018-08-21 LAB — NOVEL CORONAVIRUS, NAA: SARS-CoV-2, NAA: NOT DETECTED

## 2018-09-17 DIAGNOSIS — M9905 Segmental and somatic dysfunction of pelvic region: Secondary | ICD-10-CM | POA: Diagnosis not present

## 2018-09-17 DIAGNOSIS — M9901 Segmental and somatic dysfunction of cervical region: Secondary | ICD-10-CM | POA: Diagnosis not present

## 2018-09-17 DIAGNOSIS — M9903 Segmental and somatic dysfunction of lumbar region: Secondary | ICD-10-CM | POA: Diagnosis not present

## 2018-09-17 DIAGNOSIS — M5386 Other specified dorsopathies, lumbar region: Secondary | ICD-10-CM | POA: Diagnosis not present

## 2018-09-30 ENCOUNTER — Other Ambulatory Visit: Payer: Self-pay

## 2018-09-30 DIAGNOSIS — Z20822 Contact with and (suspected) exposure to covid-19: Secondary | ICD-10-CM

## 2018-09-30 DIAGNOSIS — R6889 Other general symptoms and signs: Secondary | ICD-10-CM | POA: Diagnosis not present

## 2018-10-01 LAB — NOVEL CORONAVIRUS, NAA: SARS-CoV-2, NAA: NOT DETECTED

## 2018-10-15 DIAGNOSIS — M5386 Other specified dorsopathies, lumbar region: Secondary | ICD-10-CM | POA: Diagnosis not present

## 2018-10-15 DIAGNOSIS — M9901 Segmental and somatic dysfunction of cervical region: Secondary | ICD-10-CM | POA: Diagnosis not present

## 2018-10-15 DIAGNOSIS — M9905 Segmental and somatic dysfunction of pelvic region: Secondary | ICD-10-CM | POA: Diagnosis not present

## 2018-10-15 DIAGNOSIS — M9903 Segmental and somatic dysfunction of lumbar region: Secondary | ICD-10-CM | POA: Diagnosis not present

## 2018-10-29 ENCOUNTER — Other Ambulatory Visit: Payer: Self-pay

## 2018-10-29 DIAGNOSIS — Z20822 Contact with and (suspected) exposure to covid-19: Secondary | ICD-10-CM

## 2018-10-31 LAB — NOVEL CORONAVIRUS, NAA: SARS-CoV-2, NAA: NOT DETECTED

## 2018-12-17 DIAGNOSIS — M9903 Segmental and somatic dysfunction of lumbar region: Secondary | ICD-10-CM | POA: Diagnosis not present

## 2018-12-17 DIAGNOSIS — M9901 Segmental and somatic dysfunction of cervical region: Secondary | ICD-10-CM | POA: Diagnosis not present

## 2018-12-17 DIAGNOSIS — M5386 Other specified dorsopathies, lumbar region: Secondary | ICD-10-CM | POA: Diagnosis not present

## 2018-12-17 DIAGNOSIS — M9905 Segmental and somatic dysfunction of pelvic region: Secondary | ICD-10-CM | POA: Diagnosis not present

## 2019-07-22 ENCOUNTER — Ambulatory Visit (INDEPENDENT_AMBULATORY_CARE_PROVIDER_SITE_OTHER): Payer: BC Managed Care – PPO | Admitting: Obstetrics and Gynecology

## 2019-07-22 ENCOUNTER — Other Ambulatory Visit: Payer: Self-pay

## 2019-07-22 ENCOUNTER — Other Ambulatory Visit (HOSPITAL_COMMUNITY)
Admission: RE | Admit: 2019-07-22 | Discharge: 2019-07-22 | Disposition: A | Discharge status: Home / Self Care | Payer: BC Managed Care – PPO | Source: Ambulatory Visit | Attending: Obstetrics and Gynecology | Admitting: Obstetrics and Gynecology

## 2019-07-22 ENCOUNTER — Encounter: Payer: Self-pay | Admitting: Obstetrics and Gynecology

## 2019-07-22 VITALS — BP 112/76 | HR 72 | Ht 61.0 in | Wt 116.0 lb

## 2019-07-22 DIAGNOSIS — Z01419 Encounter for gynecological examination (general) (routine) without abnormal findings: Secondary | ICD-10-CM

## 2019-07-22 DIAGNOSIS — Z1151 Encounter for screening for human papillomavirus (HPV): Secondary | ICD-10-CM | POA: Insufficient documentation

## 2019-07-22 MED ORDER — LO LOESTRIN FE 1 MG-10 MCG / 10 MCG PO TABS: 1.0000 | ORAL_TABLET | Freq: Every day | ORAL | 4 refills | Status: AC

## 2019-07-22 NOTE — Progress Notes (Signed)
Subjective:     Melissa Thomas is a 39 y.o. female P2 with BMI 22 who is here for a comprehensive physical exam. The patient reports no problems. She is sexually active without contraception. She is interested in starting contraceptive pills. Patient is otherwise without complaints  Past Medical History:  Diagnosis Date  . Medical history non-contributory    Past Surgical History:  Procedure Laterality Date  . NO PAST SURGERIES     Family History  Problem Relation Age of Onset  . Hypertension Father      Social History   Socioeconomic History  . Marital status: Married    Spouse name: Not on file  . Number of children: Not on file  . Years of education: Not on file  . Highest education level: Not on file  Occupational History  . Not on file  Tobacco Use  . Smoking status: Never Smoker  . Smokeless tobacco: Never Used  Vaping Use  . Vaping Use: Never used  Substance and Sexual Activity  . Alcohol use: Yes    Alcohol/week: 0.0 standard drinks    Comment: occ  . Drug use: No  . Sexual activity: Yes    Birth control/protection: None  Other Topics Concern  . Not on file  Social History Narrative  . Not on file   Social Determinants of Health   Financial Resource Strain:   . Difficulty of Paying Living Expenses:   Food Insecurity:   . Worried About Programme researcher, broadcasting/film/video in the Last Year:   . Barista in the Last Year:   Transportation Needs:   . Freight forwarder (Medical):   Marland Kitchen Lack of Transportation (Non-Medical):   Physical Activity:   . Days of Exercise per Week:   . Minutes of Exercise per Session:   Stress:   . Feeling of Stress :   Social Connections:   . Frequency of Communication with Friends and Family:   . Frequency of Social Gatherings with Friends and Family:   . Attends Religious Services:   . Active Member of Clubs or Organizations:   . Attends Banker Meetings:   Marland Kitchen Marital Status:   Intimate Partner Violence:   . Fear of  Current or Ex-Partner:   . Emotionally Abused:   Marland Kitchen Physically Abused:   . Sexually Abused:    Health Maintenance  Topic Date Due  . Hepatitis C Screening  Never done  . COVID-19 Vaccine (1) Never done  . PAP SMEAR-Modifier  01/30/2019  . INFLUENZA VACCINE  08/09/2019  . TETANUS/TDAP  03/15/2026  . HIV Screening  Completed       Review of Systems Pertinent items noted in HPI and remainder of comprehensive ROS otherwise negative.   Objective:  Blood pressure 112/76, pulse 72, height 5\' 1"  (1.549 m), weight 116 lb (52.6 kg), last menstrual period 06/22/2019.     GENERAL: Well-developed, well-nourished female in no acute distress.  HEENT: Normocephalic, atraumatic. Sclerae anicteric.  NECK: Supple. Normal thyroid.  LUNGS: Clear to auscultation bilaterally.  HEART: Regular rate and rhythm. BREASTS: Symmetric in size. No palpable masses or lymphadenopathy, skin changes, or nipple drainage. ABDOMEN: Soft, nontender, nondistended. No organomegaly. PELVIC: Normal external female genitalia. Vagina is pink and rugated.  Normal discharge. Normal appearing cervix. Uterus is normal in size. No adnexal mass or tenderness. EXTREMITIES: No cyanosis, clubbing, or edema, 2+ distal pulses.    Assessment:    Healthy female exam.      Plan:  Pap smear collected Health maintenance labs collected STI screening per patient request Patient advised to perform monthly self breast exams Patient will be contacted with abnormal results Rx Lo Loestrin provided  See After Visit Summary for Counseling Recommendations

## 2019-07-22 NOTE — Progress Notes (Signed)
Pt states no problems today, would like STD screening. Pt does not have any form of BC and is doing well.

## 2019-07-23 LAB — COMPREHENSIVE METABOLIC PANEL
ALT: 14 IU/L (ref 0–32)
AST: 14 IU/L (ref 0–40)
Albumin/Globulin Ratio: 1.4 (ref 1.2–2.2)
Albumin: 4.6 g/dL (ref 3.8–4.8)
Alkaline Phosphatase: 71 IU/L (ref 48–121)
BUN/Creatinine Ratio: 21 (ref 9–23)
BUN: 16 mg/dL (ref 6–20)
Bilirubin Total: 0.6 mg/dL (ref 0.0–1.2)
CO2: 24 mmol/L (ref 20–29)
Calcium: 9.5 mg/dL (ref 8.7–10.2)
Chloride: 102 mmol/L (ref 96–106)
Creatinine, Ser: 0.77 mg/dL (ref 0.57–1.00)
GFR calc Af Amer: 114 mL/min/{1.73_m2} (ref >59)
GFR calc non Af Amer: 99 mL/min/{1.73_m2} (ref >59)
Globulin, Total: 3.2 g/dL (ref 1.5–4.5)
Glucose: 100 mg/dL — ABNORMAL HIGH (ref 65–99)
Potassium: 4 mmol/L (ref 3.5–5.2)
Sodium: 136 mmol/L (ref 134–144)
Total Protein: 7.8 g/dL (ref 6.0–8.5)

## 2019-07-23 LAB — CBC
Hematocrit: 41.9 % (ref 34.0–46.6)
Hemoglobin: 12.8 g/dL (ref 11.1–15.9)
MCH: 25.1 pg — ABNORMAL LOW (ref 26.6–33.0)
MCHC: 30.5 g/dL — ABNORMAL LOW (ref 31.5–35.7)
MCV: 82 fL (ref 79–97)
Platelets: 247 10*3/uL (ref 150–450)
RBC: 5.09 x10E6/uL (ref 3.77–5.28)
RDW: 13.3 % (ref 11.7–15.4)
WBC: 6 10*3/uL (ref 3.4–10.8)

## 2019-07-23 LAB — LIPID PANEL
Chol/HDL Ratio: 2.9 ratio (ref 0.0–4.4)
Cholesterol, Total: 219 mg/dL — ABNORMAL HIGH (ref 100–199)
HDL: 75 mg/dL (ref >39)
LDL Chol Calc (NIH): 130 mg/dL — ABNORMAL HIGH (ref 0–99)
Triglycerides: 80 mg/dL (ref 0–149)
VLDL Cholesterol Cal: 14 mg/dL (ref 5–40)

## 2019-07-23 LAB — CERVICOVAGINAL ANCILLARY ONLY
Chlamydia: NEGATIVE
Comment: NEGATIVE
Comment: NEGATIVE
Comment: NORMAL
Neisseria Gonorrhea: NEGATIVE
Trichomonas: NEGATIVE

## 2019-07-23 LAB — HEMOGLOBIN A1C
Est. average glucose Bld gHb Est-mCnc: 108 mg/dL
Hgb A1c MFr Bld: 5.4 % (ref 4.8–5.6)

## 2019-07-23 LAB — TSH: TSH: 0.998 u[IU]/mL (ref 0.450–4.500)

## 2019-07-23 LAB — RPR: RPR Ser Ql: NONREACTIVE

## 2019-07-23 LAB — HEPATITIS B SURFACE ANTIGEN: Hepatitis B Surface Ag: NEGATIVE

## 2019-07-23 LAB — HIV ANTIBODY (ROUTINE TESTING W REFLEX): HIV Screen 4th Generation wRfx: NONREACTIVE

## 2019-07-23 LAB — HEPATITIS C ANTIBODY: Hep C Virus Ab: <0.1 s/co ratio (ref 0.0–0.9)

## 2019-07-24 LAB — CYTOLOGY - PAP
Comment: NEGATIVE
Diagnosis: NEGATIVE
High risk HPV: NEGATIVE
